# Patient Record
Sex: Male | Born: 1985 | Race: Black or African American | Hispanic: No | Marital: Single | Smoking: Former smoker
Health system: Southern US, Community
[De-identification: ages and names within clinical notes are randomized; demographics above are authoritative.]

## PROBLEM LIST (undated history)

## (undated) DIAGNOSIS — F0781 Postconcussional syndrome: Secondary | ICD-10-CM

## (undated) DIAGNOSIS — S0990XA Unspecified injury of head, initial encounter: Secondary | ICD-10-CM

## (undated) HISTORY — PX: APPENDECTOMY: SHX54

---
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
## 2008-12-12 ENCOUNTER — Emergency Department: Payer: Self-pay | Admitting: Emergency Medicine

## 2008-12-14 ENCOUNTER — Emergency Department: Payer: Self-pay | Admitting: Emergency Medicine

## 2009-01-03 ENCOUNTER — Emergency Department: Payer: Self-pay | Admitting: Emergency Medicine

## 2009-03-16 ENCOUNTER — Emergency Department: Payer: Self-pay | Admitting: Emergency Medicine

## 2009-08-13 ENCOUNTER — Emergency Department: Payer: Self-pay | Admitting: Unknown Physician Specialty

## 2009-11-05 ENCOUNTER — Emergency Department: Payer: Self-pay | Admitting: Emergency Medicine

## 2011-03-28 ENCOUNTER — Emergency Department: Payer: Self-pay | Admitting: Emergency Medicine

## 2012-08-21 ENCOUNTER — Emergency Department: Payer: Self-pay | Admitting: Emergency Medicine

## 2012-08-21 LAB — COMPREHENSIVE METABOLIC PANEL
Anion Gap: 6 — ABNORMAL LOW (ref 7–16)
Bilirubin,Total: 0.4 mg/dL (ref 0.2–1.0)
EGFR (African American): >60
Glucose: 89 mg/dL (ref 65–99)
Osmolality: 270 (ref 275–301)
SGPT (ALT): 15 U/L (ref 12–78)
Sodium: 136 mmol/L (ref 136–145)
Total Protein: 8 g/dL (ref 6.4–8.2)

## 2012-08-21 LAB — URINALYSIS, COMPLETE
Bacteria: NONE SEEN
Blood: NEGATIVE
Glucose,UR: NEGATIVE mg/dL (ref 0–75)
Ketone: NEGATIVE
Leukocyte Esterase: NEGATIVE
Nitrite: NEGATIVE
RBC,UR: <1 /HPF (ref 0–5)
Specific Gravity: 1.009 (ref 1.003–1.030)
WBC UR: 1 /HPF (ref 0–5)

## 2012-08-21 LAB — CBC
HGB: 14.2 g/dL (ref 13.0–18.0)
MCH: 30.1 pg (ref 26.0–34.0)
MCV: 84 fL (ref 80–100)
RBC: 4.73 10*6/uL (ref 4.40–5.90)
RDW: 11.6 % (ref 11.5–14.5)

## 2012-08-21 LAB — LIPASE, BLOOD: Lipase: 107 U/L (ref 73–393)

## 2013-02-24 ENCOUNTER — Emergency Department: Payer: Self-pay | Admitting: Emergency Medicine

## 2013-05-18 ENCOUNTER — Emergency Department: Payer: Self-pay | Admitting: Emergency Medicine

## 2013-05-18 LAB — CBC
HCT: 42.5 % (ref 40.0–52.0)
HGB: 14.5 g/dL (ref 13.0–18.0)
MCV: 85 fL (ref 80–100)
RBC: 5.02 10*6/uL (ref 4.40–5.90)
RDW: 11.7 % (ref 11.5–14.5)

## 2013-05-18 LAB — COMPREHENSIVE METABOLIC PANEL
Albumin: 4.6 g/dL (ref 3.4–5.0)
Alkaline Phosphatase: 71 U/L (ref 50–136)
Anion Gap: 4 — ABNORMAL LOW (ref 7–16)
Bilirubin,Total: 0.7 mg/dL (ref 0.2–1.0)
EGFR (Non-African Amer.): >60
Glucose: 98 mg/dL (ref 65–99)
Potassium: 4.1 mmol/L (ref 3.5–5.1)
SGOT(AST): 26 U/L (ref 15–37)
SGPT (ALT): 20 U/L (ref 12–78)
Sodium: 133 mmol/L — ABNORMAL LOW (ref 136–145)
Total Protein: 7.9 g/dL (ref 6.4–8.2)

## 2013-05-19 LAB — URINALYSIS, COMPLETE
Bacteria: NONE SEEN
Bilirubin,UR: NEGATIVE
Glucose,UR: NEGATIVE mg/dL (ref 0–75)
Ketone: NEGATIVE
Nitrite: NEGATIVE
Ph: 7 (ref 4.5–8.0)
Protein: NEGATIVE
Specific Gravity: 1.016 (ref 1.003–1.030)
WBC UR: <1 /HPF (ref 0–5)

## 2013-05-19 LAB — TROPONIN I: Troponin-I: <0.02 ng/mL

## 2013-10-23 ENCOUNTER — Emergency Department: Payer: Self-pay | Admitting: Emergency Medicine

## 2013-10-31 ENCOUNTER — Emergency Department: Payer: Self-pay | Admitting: Emergency Medicine

## 2014-04-23 ENCOUNTER — Emergency Department: Payer: Self-pay | Admitting: Internal Medicine

## 2014-04-23 LAB — CBC
HCT: 40.7 % (ref 40.0–52.0)
HGB: 13.4 g/dL (ref 13.0–18.0)
MCH: 28.2 pg (ref 26.0–34.0)
MCHC: 32.8 g/dL (ref 32.0–36.0)
MCV: 86 fL (ref 80–100)
Platelet: 209 10*3/uL (ref 150–440)
RBC: 4.73 10*6/uL (ref 4.40–5.90)
RDW: 11.7 % (ref 11.5–14.5)
WBC: 8.3 10*3/uL (ref 3.8–10.6)

## 2014-04-23 LAB — COMPREHENSIVE METABOLIC PANEL
ALBUMIN: 3.8 g/dL (ref 3.4–5.0)
ALT: 16 U/L
ANION GAP: 7 (ref 7–16)
Alkaline Phosphatase: 55 U/L
BUN: 9 mg/dL (ref 7–18)
Bilirubin,Total: 0.6 mg/dL (ref 0.2–1.0)
CALCIUM: 8.6 mg/dL (ref 8.5–10.1)
CO2: 27 mmol/L (ref 21–32)
Chloride: 105 mmol/L (ref 98–107)
Creatinine: 1.46 mg/dL — ABNORMAL HIGH (ref 0.60–1.30)
GLUCOSE: 83 mg/dL (ref 65–99)
Osmolality: 275 (ref 275–301)
Potassium: 3.4 mmol/L — ABNORMAL LOW (ref 3.5–5.1)
SGOT(AST): 24 U/L (ref 15–37)
SODIUM: 139 mmol/L (ref 136–145)
TOTAL PROTEIN: 6.9 g/dL (ref 6.4–8.2)

## 2014-04-23 LAB — PROTIME-INR
INR: 1.1
Prothrombin Time: 13.6 secs (ref 11.5–14.7)

## 2014-04-23 LAB — ETHANOL

## 2014-04-23 LAB — LIPASE, BLOOD: LIPASE: 79 U/L (ref 73–393)

## 2014-05-23 ENCOUNTER — Emergency Department: Payer: Self-pay | Admitting: Emergency Medicine

## 2014-09-29 ENCOUNTER — Emergency Department: Payer: Self-pay | Admitting: Emergency Medicine

## 2015-03-13 ENCOUNTER — Encounter: Payer: Self-pay | Admitting: Emergency Medicine

## 2015-03-13 ENCOUNTER — Emergency Department
Admission: EM | Admit: 2015-03-13 | Discharge: 2015-03-13 | Disposition: A | Discharge status: Home / Self Care | Payer: Managed Care, Other (non HMO) | Attending: Emergency Medicine | Admitting: Emergency Medicine

## 2015-03-13 DIAGNOSIS — N342 Other urethritis: Secondary | ICD-10-CM | POA: Insufficient documentation

## 2015-03-13 DIAGNOSIS — R300 Dysuria: Secondary | ICD-10-CM | POA: Diagnosis present

## 2015-03-13 DIAGNOSIS — Z720 Tobacco use: Secondary | ICD-10-CM | POA: Diagnosis not present

## 2015-03-13 LAB — URINALYSIS COMPLETE WITH MICROSCOPIC (ARMC ONLY)
BILIRUBIN URINE: NEGATIVE
Bacteria, UA: NONE SEEN
GLUCOSE, UA: NEGATIVE mg/dL
HGB URINE DIPSTICK: NEGATIVE
Ketones, ur: NEGATIVE mg/dL
Nitrite: NEGATIVE
PH: 6 (ref 5.0–8.0)
Protein, ur: NEGATIVE mg/dL
Specific Gravity, Urine: 1.023 (ref 1.005–1.030)

## 2015-03-13 LAB — CHLAMYDIA/NGC RT PCR (ARMC ONLY)
Chlamydia Tr: NOT DETECTED
N GONORRHOEAE: DETECTED — AB

## 2015-03-13 MED ORDER — CEFTRIAXONE SODIUM 250 MG IJ SOLR
250.0000 mg | Freq: Once | INTRAMUSCULAR | Status: AC
  Administered 2015-03-13: 250 mg via INTRAMUSCULAR
  Filled 2015-03-13: qty 250

## 2015-03-13 MED ORDER — AZITHROMYCIN 250 MG PO TABS
1000.0000 mg | ORAL_TABLET | Freq: Once | ORAL | Status: AC
  Administered 2015-03-13: 1000 mg via ORAL
  Filled 2015-03-13: qty 4

## 2015-03-13 NOTE — ED Notes (Signed)
Reports difficulty urinating and burning with urination.  NAD

## 2015-03-13 NOTE — ED Provider Notes (Signed)
[facility] Emergency Department Provider Note  ____________________________________________  Time seen:  7:57 PM  I have reviewed the triage vital signs and the nursing notes.   HISTORY  Chief Complaint Dysuria   HPI Nicholas Robles. is a 29 y.o. male this is here with complaint of dysuria for 2 days. He states he had unprotected sex last week. 2 days ago he began having dysuria along with discharge. He is unaware that his sexual partner has any problems. He denies any nausea, vomiting or fever.He denies any previous history of urinary tract infections. Pain at present is 4 out of 10. He does not have a PCP.  History reviewed. No pertinent past medical history.  There are no active problems to display for this patient.   History reviewed. No pertinent past surgical history.  No current outpatient prescriptions on file.  Allergies Review of patient's allergies indicates no known allergies.  History reviewed. No pertinent family history.  Social History History  Substance Use Topics  . Smoking status: Current Every Day Smoker  . Smokeless tobacco: Not on file  . Alcohol Use: Yes    Review of Systems Constitutional: No fever/chills Eyes: No visual changes. ENT: No sore throat. Cardiovascular: Denies chest pain. Respiratory: Denies shortness of breath. Gastrointestinal: No abdominal pain.  No nausea, no vomiting.  Genitourinary: Positive for dysuria. Musculoskeletal: Negative for back pain. Skin: Negative for rash. Neurological: Negative for headaches, focal weakness or numbness.  10-point ROS otherwise negative.  ____________________________________________   PHYSICAL EXAM:  VITAL SIGNS: ED Triage Vitals  Enc Vitals Group     BP 03/13/15 1856 131/73 mmHg     Pulse Rate 03/13/15 1856 58     Resp 03/13/15 1856 16     Temp 03/13/15 1856 98.1 F (36.7 C)     Temp Source 03/13/15 1856 Oral     SpO2 03/13/15 1856 100 %   Weight 03/13/15 1856 140 lb (63.504 kg)     Height 03/13/15 1856  (1.727 m)     Head Cir --      Peak Flow --      Pain Score 03/13/15 1857 4     Pain Loc --      Pain Edu? --      Excl. in GC? --     Constitutional: Alert and oriented. Well appearing and in no acute distress. Eyes: Conjunctivae are normal. PERRL. EOMI. Head: Atraumatic. Nose: No congestion/rhinnorhea. Neck: No stridor.   Cardiovascular: Normal rate, regular rhythm. Grossly normal heart sounds.  Good peripheral circulation. Respiratory: Normal respiratory effort.  No retractions. Lungs CTAB. Gastrointestinal: Soft and nontender. No distention.  No CVA tenderness. Musculoskeletal: No lower extremity tenderness nor edema.  No joint effusions. Neurologic:  Normal speech and language. No gross focal neurologic deficits are appreciated. No gait instability. Skin:  Skin is warm, dry and intact. No rash noted. Psychiatric: Mood and affect are normal. Speech and behavior are normal.  ____________________________________________   LABS (all labs ordered are listed, but only abnormal results are displayed)  Labs Reviewed  URINALYSIS COMPLETEWITH MICROSCOPIC ([facility] ONLY) - Abnormal; Notable for the following:    Color, Urine YELLOW (*)    APPearance HAZY (*)    Leukocytes, UA 2+ (*)    Squamous Epithelial / LPF 0-5 (*)    All other components within normal limits  CHLAMYDIA/NGC RT PCR ([facility] ONLY)    PROCEDURES  Procedure(s) performed: None  Critical Care performed: No  ____________________________________________  INITIAL IMPRESSION / ASSESSMENT AND PLAN / ED COURSE  Pertinent labs & imaging results that were available during my care of the patient were reviewed by me and considered in my medical decision making (see chart for details).  Urinalysis shows stay BCs to numerous to count with negative nitrites and no bacteria. Patient at high risk for STDs was treated with Rocephin 250 mg IM and  Zithromax 1 g by mouth. He was instructed not to have sex until his partner has been treated. He was given information about [facility] ST clinic. ____________________________________________   FINAL CLINICAL IMPRESSION(S) / ED DIAGNOSES  Final diagnoses:  Urethritis      Tommi Rumps, PA-C 03/13/15 2048  Emily Filbert, MD 03/13/15 2125

## 2015-03-13 NOTE — Discharge Instructions (Signed)
Urethritis Urethritis is an inflammation of the tube through which urine exits your bladder (urethra).  CAUSES Urethritis is often caused by an infection in your urethra. The infection can be viral, like herpes. The infection can also be bacterial, like gonorrhea. RISK FACTORS Risk factors of urethritis include:  Having sex without using a condom.  Having multiple sexual partners.  Having poor hygiene. SIGNS AND SYMPTOMS Symptoms of urethritis are less noticeable in women than in men. These symptoms include:  Burning feeling when you urinate (dysuria).  Discharge from your urethra.  Blood in your urine (hematuria).  Urinating more than usual. DIAGNOSIS  To confirm a diagnosis of urethritis, your health care provider will do the following:  Ask about your sexual history.  Perform a physical exam.  Have you provide a sample of your urine for lab testing.  Use a cotton swab to gently collect a sample from your urethra for lab testing. TREATMENT  It is important to treat urethritis. Depending on the cause, untreated urethritis may lead to serious genital infections and possibly infertility. Urethritis caused by a bacterial infection is treated with antibiotic medicine. All sexual partners must be treated.  HOME CARE INSTRUCTIONS  Do not have sex until the test results are known and treatment is completed, even if your symptoms go away before you finish treatment.  If you were prescribed an antibiotic, finish it all even if you start to feel better. SEEK MEDICAL CARE IF:   Your symptoms are not improved in 3 days.  Your symptoms are getting worse.  You develop abdominal pain or pelvic pain (in women).  You develop joint pain.  You have a fever. SEEK IMMEDIATE MEDICAL CARE IF:   You have severe pain in the belly, back, or side.  You have repeated vomiting. MAKE SURE YOU:  Understand these instructions.  Will watch your condition.  Will get help right away if you  are not doing well or get worse. Document Released: 01/29/2001 Document Revised: 12/20/2013 Document Reviewed: 04/05/2013 [facility] Patient Information 2015 [place], [place]. This information is not intended to replace advice given to you by your health care provider. Make sure you discuss any questions you have with your health care provider.   HAVE YOUR PARTNER GO TO [facility] DEPARTMENT FOR SCREENING

## 2015-08-29 ENCOUNTER — Emergency Department: Payer: Managed Care, Other (non HMO)

## 2015-08-29 ENCOUNTER — Encounter: Payer: Self-pay | Admitting: *Deleted

## 2015-08-29 ENCOUNTER — Emergency Department
Admission: EM | Admit: 2015-08-29 | Discharge: 2015-08-29 | Disposition: A | Discharge status: Home / Self Care | Payer: Managed Care, Other (non HMO) | Attending: Emergency Medicine | Admitting: Emergency Medicine

## 2015-08-29 DIAGNOSIS — S5001XA Contusion of right elbow, initial encounter: Secondary | ICD-10-CM

## 2015-08-29 DIAGNOSIS — F17200 Nicotine dependence, unspecified, uncomplicated: Secondary | ICD-10-CM | POA: Insufficient documentation

## 2015-08-29 DIAGNOSIS — S8001XA Contusion of right knee, initial encounter: Secondary | ICD-10-CM | POA: Insufficient documentation

## 2015-08-29 DIAGNOSIS — Y998 Other external cause status: Secondary | ICD-10-CM | POA: Diagnosis not present

## 2015-08-29 DIAGNOSIS — S4991XA Unspecified injury of right shoulder and upper arm, initial encounter: Secondary | ICD-10-CM | POA: Insufficient documentation

## 2015-08-29 DIAGNOSIS — Y92410 Unspecified street and highway as the place of occurrence of the external cause: Secondary | ICD-10-CM | POA: Diagnosis not present

## 2015-08-29 DIAGNOSIS — Y9389 Activity, other specified: Secondary | ICD-10-CM | POA: Diagnosis not present

## 2015-08-29 DIAGNOSIS — S59901A Unspecified injury of right elbow, initial encounter: Secondary | ICD-10-CM | POA: Diagnosis present

## 2015-08-29 MED ORDER — IBUPROFEN 800 MG PO TABS: 800.0000 mg | ORAL_TABLET | Freq: Three times a day (TID) | ORAL | Status: DC | PRN

## 2015-08-29 MED ORDER — CYCLOBENZAPRINE HCL 10 MG PO TABS: 10.0000 mg | ORAL_TABLET | Freq: Three times a day (TID) | ORAL | Status: DC | PRN
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
## 2015-12-19 ENCOUNTER — Emergency Department
Admission: EM | Admit: 2015-12-19 | Discharge: 2015-12-19 | Disposition: A | Discharge status: Home / Self Care | Payer: Managed Care, Other (non HMO) | Attending: Emergency Medicine | Admitting: Emergency Medicine

## 2015-12-19 ENCOUNTER — Encounter: Payer: Self-pay | Admitting: Emergency Medicine

## 2015-12-19 DIAGNOSIS — F17200 Nicotine dependence, unspecified, uncomplicated: Secondary | ICD-10-CM | POA: Insufficient documentation

## 2015-12-19 DIAGNOSIS — J029 Acute pharyngitis, unspecified: Secondary | ICD-10-CM | POA: Diagnosis present

## 2015-12-19 DIAGNOSIS — B349 Viral infection, unspecified: Secondary | ICD-10-CM

## 2015-12-19 DIAGNOSIS — Z79899 Other long term (current) drug therapy: Secondary | ICD-10-CM | POA: Diagnosis not present

## 2015-12-19 LAB — POCT RAPID STREP A: STREPTOCOCCUS, GROUP A SCREEN (DIRECT): NEGATIVE

## 2015-12-19 NOTE — Discharge Instructions (Signed)
Viral Infections °A viral infection can be caused by different types of viruses. Most viral infections are not serious and resolve on their own. However, some infections may cause severe symptoms and may lead to further complications. °SYMPTOMS °Viruses can frequently cause: °· Minor sore throat. °· Aches and pains. °· Headaches. °· Runny nose. °· Different types of rashes. °· Watery eyes. °· Tiredness. °· Cough. °· Loss of appetite. °· Gastrointestinal infections, resulting in nausea, vomiting, and diarrhea. °These symptoms do not respond to antibiotics because the infection is not caused by bacteria. However, you might catch a bacterial infection following the viral infection. This is sometimes called a "superinfection." Symptoms of such a bacterial infection may include: °· Worsening sore throat with pus and difficulty swallowing. °· Swollen neck glands. °· Chills and a high or persistent fever. °· Severe headache. °· Tenderness over the sinuses. °· Persistent overall ill feeling (malaise), muscle aches, and tiredness (fatigue). °· Persistent cough. °· Yellow, green, or brown mucus production with coughing. °HOME CARE INSTRUCTIONS  °· Only take over-the-counter or prescription medicines for pain, discomfort, diarrhea, or fever as directed by your caregiver. °· Drink enough water and fluids to keep your urine clear or pale yellow. Sports drinks can provide valuable electrolytes, sugars, and hydration. °· Get plenty of rest and maintain proper nutrition. Soups and broths with crackers or rice are fine. °SEEK IMMEDIATE MEDICAL CARE IF:  °· You have severe headaches, shortness of breath, chest pain, neck pain, or an unusual rash. °· You have uncontrolled vomiting, diarrhea, or you are unable to keep down fluids. °· You or your child has an oral temperature above 102° F (38.9° C), not controlled by medicine. °· Your baby is older than 3 months with a rectal temperature of 102° F (38.9° C) or higher. °· Your baby is 3  months old or younger with a rectal temperature of 100.4° F (38° C) or higher. °MAKE SURE YOU:  °· Understand these instructions. °· Will watch your condition. °· Will get help right away if you are not doing well or get worse. °  °This information is not intended to replace advice given to you by your health care provider. Make sure you discuss any questions you have with your health care provider. °  °Document Released: 05/15/2005 Document Revised: 10/28/2011 Document Reviewed: 01/11/2015 °Elsevier Interactive Patient Education ©2016 Elsevier Inc. ° °

## 2015-12-19 NOTE — ED Notes (Signed)
Pt to ed with c/o cold chills, sore throat x 1 day.

## 2015-12-19 NOTE — ED Provider Notes (Signed)
[facility] Emergency Department Provider Note  ____________________________________________  Time seen: Approximately 5:28 PM  I have reviewed the triage vital signs and the nursing notes.   HISTORY  Chief Complaint Sore Throat    HPI Nicholas DomeKendrick E Owczarzak Jr. is a 30 y.o. male , NAD, presents emergency department with 24 hours of sore throat and body aches. States he feels slightly fatigued. Has not had any fevers, chills, abdominal pain, nausea, vomiting, diarrhea. No significant nasal congestion, runny nose, ear pain, sinus pressure. Has not taken anything over-the-counter at this time for his symptoms. No known sick contacts.   History reviewed. No pertinent past medical history.  There are no active problems to display for this patient.   History reviewed. No pertinent past surgical history.  Current Outpatient Rx  Name  Route  Sig  Dispense  Refill  . cyclobenzaprine (FLEXERIL) 10 MG tablet   Oral   Take 1 tablet (10 mg total) by mouth every 8 (eight) hours as needed for muscle spasms.   30 tablet   1   . ibuprofen (ADVIL,MOTRIN) 800 MG tablet   Oral   Take 1 tablet (800 mg total) by mouth every 8 (eight) hours as needed.   30 tablet   0     Allergies Review of patient's allergies indicates no known allergies.  History reviewed. No pertinent family history.  Social History Social History  Substance Use Topics  . Smoking status: Current Every Day Smoker  . Smokeless tobacco: None  . Alcohol Use: Yes     Review of Systems  Constitutional: Positive chills, fatigue. No fever/chills Eyes: No visual changes. No discharge, swelling, redness ENT: Positive sore throat. No sinus pressure, ear pain, congestion, runny nose Cardiovascular: No chest pain. Respiratory: Positive occasional cough. No congestion, shortness of breath. No wheezing.  Gastrointestinal: No abdominal pain.  No nausea, vomiting.  No diarrhea.   Musculoskeletal:  Negative for general myalgias  Skin: Negative for rash. Neurological: Negative for headaches, focal weakness or numbness. 10-point ROS otherwise negative.  ____________________________________________   PHYSICAL EXAM:  VITAL SIGNS: ED Triage Vitals  Enc Vitals Group     BP 12/19/15 1635 128/74 mmHg     Pulse Rate 12/19/15 1635 98     Resp 12/19/15 1635 16     Temp 12/19/15 1635 98.1 F (36.7 C)     Temp Source 12/19/15 1635 Oral     SpO2 12/19/15 1635 98 %     Weight 12/19/15 1635 170 lb (77.111 kg)     Height 12/19/15 1635 6\' 1"  (1.854 m)     Head Cir --      Peak Flow --      Pain Score 12/19/15 1623 8     Pain Loc --      Pain Edu? --      Excl. in GC? --      Constitutional: Alert and oriented. Well appearing and in no acute distress. Eyes: Conjunctivae are normal. PERRL. EOMI without pain.  Head: Atraumatic. ENT:      Ears: TMs visualized bilaterally without effusion, bulging, perforation, erythema      Nose: No congestion/rhinnorhea. Turbinates mildly injected.      Mouth/Throat: Mucous membranes are moist. Pharynx with mild injection but no overt erythema. No pharyngeal swelling or exudates. Uvula is midline. Clear postnasal drip. Neck: No cervical spine tenderness to palpation. Supple for range of motion. Hematological/Lymphatic/Immunilogical: No cervical lymphadenopathy. Cardiovascular: Normal rate, regular rhythm. Normal S1 and S2.  Respiratory: Normal respiratory effort without tachypnea or retractions. Lungs CTAB with breath sounds noted in all lung fields. Neurologic:  Normal speech and language. No gross focal neurologic deficits are appreciated.  Skin:  Skin is warm, dry and intact. No rash noted. Psychiatric: Mood and affect are normal. Speech and behavior are normal. Patient exhibits appropriate insight and judgement.   ____________________________________________   LABS (all labs ordered are listed, but only abnormal results are displayed)  Labs  Reviewed  CULTURE, GROUP A STREP [facility])  POCT RAPID STREP A   ____________________________________________  EKG  None ____________________________________________  RADIOLOGY  None ____________________________________________    PROCEDURES  Procedure(s) performed: None    Medications - No data to display   ____________________________________________   INITIAL IMPRESSION / ASSESSMENT AND PLAN / ED COURSE  Pertinent lab results that were available during my care of the patient were reviewed by me and considered in my medical decision making (see chart for details).  Patient's diagnosis is consistent with file infection. Patient will be discharged home with instructions to take over-the-counter cough and cold medications as needed for symptom Medicare. Patient is to follow up with [facility] if symptoms persist past this treatment course. Patient is given ED precautions to return to the ED for any worsening or new symptoms.      ____________________________________________  FINAL CLINICAL IMPRESSION(S) / ED DIAGNOSES  Final diagnoses:  Viral infection      NEW MEDICATIONS STARTED DURING THIS VISIT:  Discharge Medication List as of 12/19/2015  5:28 PM           Hope Pigeon, PA-C 12/19/15 1806  Sharman Cheek, MD 12/21/15 0015

## 2015-12-22 LAB — CULTURE, GROUP A STREP (THRC)

## 2015-12-24 ENCOUNTER — Emergency Department
Admission: EM | Admit: 2015-12-24 | Discharge: 2015-12-24 | Disposition: A | Discharge status: Home / Self Care | Payer: Managed Care, Other (non HMO) | Attending: Emergency Medicine | Admitting: Emergency Medicine

## 2015-12-24 ENCOUNTER — Encounter: Payer: Self-pay | Admitting: Emergency Medicine

## 2015-12-24 ENCOUNTER — Emergency Department: Payer: Managed Care, Other (non HMO)

## 2015-12-24 DIAGNOSIS — R0789 Other chest pain: Secondary | ICD-10-CM | POA: Insufficient documentation

## 2015-12-24 DIAGNOSIS — M25511 Pain in right shoulder: Secondary | ICD-10-CM | POA: Insufficient documentation

## 2015-12-24 DIAGNOSIS — S161XXA Strain of muscle, fascia and tendon at neck level, initial encounter: Secondary | ICD-10-CM | POA: Insufficient documentation

## 2015-12-24 DIAGNOSIS — Y999 Unspecified external cause status: Secondary | ICD-10-CM | POA: Insufficient documentation

## 2015-12-24 DIAGNOSIS — F17200 Nicotine dependence, unspecified, uncomplicated: Secondary | ICD-10-CM | POA: Insufficient documentation

## 2015-12-24 DIAGNOSIS — G44319 Acute post-traumatic headache, not intractable: Secondary | ICD-10-CM

## 2015-12-24 DIAGNOSIS — Y939 Activity, unspecified: Secondary | ICD-10-CM | POA: Insufficient documentation

## 2015-12-24 DIAGNOSIS — Y92410 Unspecified street and highway as the place of occurrence of the external cause: Secondary | ICD-10-CM | POA: Insufficient documentation

## 2015-12-24 DIAGNOSIS — M542 Cervicalgia: Secondary | ICD-10-CM | POA: Diagnosis present

## 2015-12-24 DIAGNOSIS — R52 Pain, unspecified: Secondary | ICD-10-CM

## 2015-12-24 LAB — BASIC METABOLIC PANEL
Anion gap: 9 (ref 5–15)
BUN: 10 mg/dL (ref 6–20)
CALCIUM: 9.2 mg/dL (ref 8.9–10.3)
CHLORIDE: 103 mmol/L (ref 101–111)
CO2: 27 mmol/L (ref 22–32)
CREATININE: 1.12 mg/dL (ref 0.61–1.24)
Glucose, Bld: 98 mg/dL (ref 65–99)
Potassium: 3.7 mmol/L (ref 3.5–5.1)
SODIUM: 139 mmol/L (ref 135–145)

## 2015-12-24 LAB — CBC WITH DIFFERENTIAL/PLATELET
Basophils Absolute: 0 10*3/uL (ref 0–0.1)
Basophils Relative: 0 %
EOS ABS: 0.1 10*3/uL (ref 0–0.7)
HCT: 39.2 % — ABNORMAL LOW (ref 40.0–52.0)
HEMOGLOBIN: 13.3 g/dL (ref 13.0–18.0)
LYMPHS ABS: 1.9 10*3/uL (ref 1.0–3.6)
Lymphocytes Relative: 20 %
MCH: 28.3 pg (ref 26.0–34.0)
MCHC: 34 g/dL (ref 32.0–36.0)
MCV: 83.4 fL (ref 80.0–100.0)
MONO ABS: 0.5 10*3/uL (ref 0.2–1.0)
Neutro Abs: 7.1 10*3/uL — ABNORMAL HIGH (ref 1.4–6.5)
Platelets: 184 10*3/uL (ref 150–440)
RBC: 4.71 MIL/uL (ref 4.40–5.90)
RDW: 11.6 % (ref 11.5–14.5)
WBC: 9.5 10*3/uL (ref 3.8–10.6)

## 2015-12-24 MED ORDER — ACETAMINOPHEN 500 MG PO TABS
1000.0000 mg | ORAL_TABLET | Freq: Once | ORAL | Status: AC
  Administered 2015-12-24: 1000 mg via ORAL
  Filled 2015-12-24: qty 2

## 2015-12-24 MED ORDER — LORAZEPAM 1 MG PO TABS
1.0000 mg | ORAL_TABLET | Freq: Once | ORAL | Status: AC
  Administered 2015-12-24: 1 mg via ORAL
  Filled 2015-12-24: qty 1

## 2015-12-24 MED ORDER — HYDROCODONE-ACETAMINOPHEN 5-325 MG PO TABS: 1.0000 | ORAL_TABLET | ORAL | Status: DC | PRN
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
## 2015-12-24 NOTE — Discharge Instructions (Signed)
Ice and elevate as needed for pain and swelling. Norco as needed for severe pain every 4 hours as needed. [facility] if any continued problems. It was suggested on your MRA that you have yearly checkups. This can be best done by obtaining a primary care doctor to order this and have an annual physical. Today's MRI does not suggest that you have an aneurysm.
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
## 2016-05-28 ENCOUNTER — Emergency Department
Admission: EM | Admit: 2016-05-28 | Discharge: 2016-05-28 | Disposition: A | Discharge status: Home / Self Care | Payer: Managed Care, Other (non HMO) | Attending: Emergency Medicine | Admitting: Emergency Medicine

## 2016-05-28 ENCOUNTER — Encounter: Payer: Self-pay | Admitting: Emergency Medicine

## 2016-05-28 DIAGNOSIS — R51 Headache: Secondary | ICD-10-CM | POA: Insufficient documentation

## 2016-05-28 DIAGNOSIS — Z79899 Other long term (current) drug therapy: Secondary | ICD-10-CM | POA: Diagnosis not present

## 2016-05-28 DIAGNOSIS — F17200 Nicotine dependence, unspecified, uncomplicated: Secondary | ICD-10-CM | POA: Diagnosis not present

## 2016-05-28 DIAGNOSIS — R519 Headache, unspecified: Secondary | ICD-10-CM

## 2016-05-28 HISTORY — DX: Unspecified injury of head, initial encounter: S09.90XA

## 2016-05-28 LAB — CBC
HEMATOCRIT: 40.8 % (ref 40.0–52.0)
HEMOGLOBIN: 13.8 g/dL (ref 13.0–18.0)
MCH: 28.2 pg (ref 26.0–34.0)
MCHC: 33.7 g/dL (ref 32.0–36.0)
MCV: 83.6 fL (ref 80.0–100.0)
Platelets: 150 10*3/uL (ref 150–440)
RBC: 4.88 MIL/uL (ref 4.40–5.90)
RDW: 11.8 % (ref 11.5–14.5)
WBC: 5.5 10*3/uL (ref 3.8–10.6)

## 2016-05-28 LAB — BASIC METABOLIC PANEL
ANION GAP: 5 (ref 5–15)
BUN: 12 mg/dL (ref 6–20)
CALCIUM: 9.1 mg/dL (ref 8.9–10.3)
CHLORIDE: 104 mmol/L (ref 101–111)
CO2: 29 mmol/L (ref 22–32)
Creatinine, Ser: 1.25 mg/dL — ABNORMAL HIGH (ref 0.61–1.24)
GFR calc non Af Amer: >60 mL/min (ref >60)
GLUCOSE: 97 mg/dL (ref 65–99)
Potassium: 4.2 mmol/L (ref 3.5–5.1)
Sodium: 138 mmol/L (ref 135–145)

## 2016-05-28 MED ORDER — SODIUM CHLORIDE 0.9 % IV BOLUS (SEPSIS)
1000.0000 mL | Freq: Once | INTRAVENOUS | Status: AC
  Administered 2016-05-28: 1000 mL via INTRAVENOUS

## 2016-05-28 MED ORDER — PROCHLORPERAZINE EDISYLATE 5 MG/ML IJ SOLN
10.0000 mg | Freq: Once | INTRAMUSCULAR | Status: AC
  Administered 2016-05-28: 10 mg via INTRAVENOUS
  Filled 2016-05-28: qty 2

## 2016-05-28 MED ORDER — KETOROLAC TROMETHAMINE 30 MG/ML IJ SOLN
30.0000 mg | Freq: Once | INTRAMUSCULAR | Status: AC
  Administered 2016-05-28: 30 mg via INTRAVENOUS
  Filled 2016-05-28: qty 1

## 2016-05-28 MED ORDER — DIPHENHYDRAMINE HCL 50 MG/ML IJ SOLN
12.5000 mg | Freq: Once | INTRAMUSCULAR | Status: AC
  Administered 2016-05-28: 12.5 mg via INTRAVENOUS
  Filled 2016-05-28: qty 1
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
## 2016-05-28 NOTE — ED Notes (Signed)
Pt resting in bed, eyes closed, resp even and unlabored, family at bedside 
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
## 2016-06-23 ENCOUNTER — Encounter: Payer: Self-pay | Admitting: Emergency Medicine

## 2016-06-23 ENCOUNTER — Emergency Department
Admission: EM | Admit: 2016-06-23 | Discharge: 2016-06-23 | Disposition: A | Discharge status: Home / Self Care | Payer: Managed Care, Other (non HMO) | Attending: Emergency Medicine | Admitting: Emergency Medicine

## 2016-06-23 DIAGNOSIS — Y939 Activity, unspecified: Secondary | ICD-10-CM | POA: Diagnosis not present

## 2016-06-23 DIAGNOSIS — S3992XA Unspecified injury of lower back, initial encounter: Secondary | ICD-10-CM | POA: Diagnosis present

## 2016-06-23 DIAGNOSIS — F17200 Nicotine dependence, unspecified, uncomplicated: Secondary | ICD-10-CM | POA: Insufficient documentation

## 2016-06-23 DIAGNOSIS — Y92410 Unspecified street and highway as the place of occurrence of the external cause: Secondary | ICD-10-CM | POA: Diagnosis not present

## 2016-06-23 DIAGNOSIS — S161XXA Strain of muscle, fascia and tendon at neck level, initial encounter: Secondary | ICD-10-CM | POA: Insufficient documentation

## 2016-06-23 DIAGNOSIS — Y999 Unspecified external cause status: Secondary | ICD-10-CM | POA: Insufficient documentation

## 2016-06-23 DIAGNOSIS — S60221A Contusion of right hand, initial encounter: Secondary | ICD-10-CM | POA: Diagnosis not present

## 2016-06-23 DIAGNOSIS — S39012A Strain of muscle, fascia and tendon of lower back, initial encounter: Secondary | ICD-10-CM | POA: Insufficient documentation

## 2016-06-23 DIAGNOSIS — Z79899 Other long term (current) drug therapy: Secondary | ICD-10-CM | POA: Insufficient documentation

## 2016-06-23 DIAGNOSIS — S29019A Strain of muscle and tendon of unspecified wall of thorax, initial encounter: Secondary | ICD-10-CM

## 2016-06-23 MED ORDER — CYCLOBENZAPRINE HCL 5 MG PO TABS: 5.0000 mg | ORAL_TABLET | Freq: Three times a day (TID) | ORAL | 0 refills | Status: DC | PRN

## 2016-06-23 MED ORDER — IBUPROFEN 800 MG PO TABS: 800.0000 mg | ORAL_TABLET | Freq: Three times a day (TID) | ORAL | 0 refills | Status: DC | PRN
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
## 2016-06-23 NOTE — Discharge Instructions (Signed)
Please take medications as prescribed. Return to the ER for any worsening symptoms urgent changes in her health. °

## 2016-06-24 DIAGNOSIS — F0781 Postconcussional syndrome: Secondary | ICD-10-CM | POA: Insufficient documentation

## 2016-06-24 DIAGNOSIS — G44229 Chronic tension-type headache, not intractable: Secondary | ICD-10-CM | POA: Insufficient documentation

## 2016-10-30 DIAGNOSIS — K219 Gastro-esophageal reflux disease without esophagitis: Secondary | ICD-10-CM | POA: Insufficient documentation

## 2016-12-23 ENCOUNTER — Emergency Department
Admission: EM | Admit: 2016-12-23 | Discharge: 2016-12-23 | Disposition: A | Discharge status: Home / Self Care | Payer: Managed Care, Other (non HMO) | Attending: Emergency Medicine | Admitting: Emergency Medicine

## 2016-12-23 ENCOUNTER — Encounter: Payer: Self-pay | Admitting: Emergency Medicine

## 2016-12-23 DIAGNOSIS — Z79899 Other long term (current) drug therapy: Secondary | ICD-10-CM | POA: Insufficient documentation

## 2016-12-23 DIAGNOSIS — G43909 Migraine, unspecified, not intractable, without status migrainosus: Secondary | ICD-10-CM | POA: Diagnosis present

## 2016-12-23 DIAGNOSIS — F17200 Nicotine dependence, unspecified, uncomplicated: Secondary | ICD-10-CM | POA: Insufficient documentation

## 2016-12-23 DIAGNOSIS — G43809 Other migraine, not intractable, without status migrainosus: Secondary | ICD-10-CM

## 2016-12-23 MED ORDER — BUTALBITAL-APAP-CAFFEINE 50-325-40 MG PO TABS: 1.0000 | ORAL_TABLET | Freq: Four times a day (QID) | ORAL | 0 refills | Status: DC | PRN

## 2016-12-23 MED ORDER — KETOROLAC TROMETHAMINE 30 MG/ML IJ SOLN
30.0000 mg | Freq: Once | INTRAMUSCULAR | Status: DC
Start: 2016-12-23 — End: 2016-12-23
  Filled 2016-12-23: qty 1

## 2016-12-23 MED ORDER — DIPHENHYDRAMINE HCL 50 MG/ML IJ SOLN
25.0000 mg | Freq: Once | INTRAMUSCULAR | Status: DC
  Filled 2016-12-23: qty 1

## 2016-12-23 MED ORDER — SODIUM CHLORIDE 0.9 % IV BOLUS (SEPSIS): 1000.0000 mL | Freq: Once | INTRAVENOUS | Status: DC

## 2016-12-23 MED ORDER — METOCLOPRAMIDE HCL 5 MG/ML IJ SOLN
10.0000 mg | Freq: Once | INTRAMUSCULAR | Status: DC
  Filled 2016-12-23: qty 2

## 2016-12-23 MED ORDER — BUTALBITAL-APAP-CAFFEINE 50-325-40 MG PO TABS
2.0000 | ORAL_TABLET | Freq: Once | ORAL | Status: AC
  Administered 2016-12-23: 2 via ORAL
  Filled 2016-12-23: qty 2

## 2016-12-23 NOTE — Discharge Instructions (Signed)
Please follow up with the acute care clinic °

## 2016-12-23 NOTE — ED Provider Notes (Signed)
[facility] Emergency Department Provider Note   ____________________________________________   First MD Initiated Contact with Patient 12/23/16 0403     (approximate)  I have reviewed the triage vital signs and the nursing notes.   HISTORY  Chief Complaint Migraine    HPI Nicholas DomeKendrick E Punch Jr. is a 31 y.o. male who comes into the hospital today with a headache. He reports that he's had this headache all day. The patient took 4 pills of nortriptyline to in the morning and 2 in the evening. He reports though that the pain has persisted. He's been unable to sleep. He has a history of migraines to post concussive syndrome. The patient denies any nausea or vomiting denies any dizziness denies any blurred vision. He rates his pain a 6 out of 10 in intensity. The patient reports that he has headaches typically every other day. He reports that he couldn't deal with it so he came in for evaluation. He's had no numbness and tingling   Past Medical History:  Diagnosis Date  . Head injury     There are no active problems to display for this patient.   Past Surgical History:  Procedure Laterality Date  . APPENDECTOMY      Prior to Admission medications   Medication Sig Start Date End Date Taking? Authorizing Provider  butalbital-acetaminophen-caffeine (FIORICET, ESGIC) [phone] MG tablet Take 1-2 tablets by mouth every 6 (six) hours as needed for headache. 12/23/16 12/23/17  Rebecka ApleyWebster, Allison P, MD  cyclobenzaprine (FLEXERIL) 5 MG tablet Take 1-2 tablets (5-10 mg total) by mouth 3 (three) times daily as needed for muscle spasms. 06/23/16   Evon SlackGaines, Thomas C, PA-C  HYDROcodone-acetaminophen (NORCO/VICODIN) 5-325 MG tablet Take 1 tablet by mouth every 4 (four) hours as needed for moderate pain. 12/24/15   Tommi RumpsSummers, Rhonda L, PA-C  ibuprofen (ADVIL,MOTRIN) 800 MG tablet Take 1 tablet (800 mg total) by mouth every 8 (eight) hours as needed. 06/23/16   Evon SlackGaines, Thomas C, PA-C      Allergies Patient has no known allergies.  No family history on file.  Social History Social History  Substance Use Topics  . Smoking status: Current Every Day Smoker  . Smokeless tobacco: Never Used  . Alcohol use Yes    Review of Systems  Constitutional: No fever/chills Eyes: No visual changes. ENT: No sore throat. Cardiovascular: Denies chest pain. Respiratory: Denies shortness of breath. Gastrointestinal: No abdominal pain.  No nausea, no vomiting.  No diarrhea.  No constipation. Genitourinary: Negative for dysuria. Musculoskeletal: Negative for back pain. Skin: Negative for rash. Neurological: Headache   ____________________________________________   PHYSICAL EXAM:  VITAL SIGNS: ED Triage Vitals [12/23/16 0229]  Enc Vitals Group     BP 132/81     Pulse Rate 88     Resp 18     Temp 98.2 F (36.8 C)     Temp Source Oral     SpO2 95 %     Weight 170 lb (77.1 kg)     Height 6\' 2"  (1.88 m)     Head Circumference      Peak Flow      Pain Score 10     Pain Loc      Pain Edu?      Excl. in GC?     Constitutional: Alert and oriented. Well appearing and in no acute distress. Eyes: Conjunctivae are normal. PERRL. EOMI. Head: Atraumatic. Nose: No congestion/rhinnorhea. Mouth/Throat: Mucous membranes are moist.  Oropharynx non-erythematous. Cardiovascular: Normal  rate, regular rhythm. Grossly normal heart sounds.  Good peripheral circulation. Respiratory: Normal respiratory effort.  No retractions. Lungs CTAB. Gastrointestinal: Soft and nontender. No distention. Positive bowel sounds Musculoskeletal: No lower extremity tenderness nor edema.  No joint effusions. Neurologic:  Normal speech and language. Cranial nerves II through XII are grossly intact with no focal motor or neuro deficits Skin:  Skin is warm, dry and intact. No rash noted. Psychiatric: Mood and affect are normal. Speech and behavior are  normal.  ____________________________________________   LABS (all labs ordered are listed, but only abnormal results are displayed)  Labs Reviewed - No data to display ____________________________________________  EKG  none ____________________________________________  RADIOLOGY  none ____________________________________________   PROCEDURES  Procedure(s) performed: None  Procedures  Critical Care performed: No  ____________________________________________   INITIAL IMPRESSION / ASSESSMENT AND PLAN / ED COURSE  Pertinent labs & imaging results that were available during my care of the patient were reviewed by me and considered in my medical decision making (see chart for details).  This is a 31 year old male who comes into the hospital today with a headache. I did initially order some IV medications but the patient reports that he does not want to be stuck with a needle. He reports that he thinks the pain is better enough that he could just take pills. I did give the patient a fair assessment 1 back in to check on him he was asleep comfortably. The patient will be discharged home to follow-up with the acute care clinic.      ____________________________________________   FINAL CLINICAL IMPRESSION(S) / ED DIAGNOSES  Final diagnoses:  Other migraine without status migrainosus, not intractable      NEW MEDICATIONS STARTED DURING THIS VISIT:  New Prescriptions   BUTALBITAL-ACETAMINOPHEN-CAFFEINE (FIORICET, ESGIC) 50-325-40 MG TABLET    Take 1-2 tablets by mouth every 6 (six) hours as needed for headache.     Note:  This document was prepared using Dragon voice recognition software and may include unintentional dictation errors.    Rebecka Apley, MD 12/23/16 [phone]

## 2016-12-23 NOTE — ED Triage Notes (Signed)
Patient with complaint of migraine that started earlier today. Patient states that he has taken medications with no relief.

## 2016-12-27 ENCOUNTER — Encounter: Payer: Self-pay | Admitting: *Deleted

## 2016-12-27 ENCOUNTER — Emergency Department
Admission: EM | Admit: 2016-12-27 | Discharge: 2016-12-27 | Disposition: A | Discharge status: Home / Self Care | Payer: Managed Care, Other (non HMO) | Attending: Emergency Medicine | Admitting: Emergency Medicine

## 2016-12-27 DIAGNOSIS — Z202 Contact with and (suspected) exposure to infections with a predominantly sexual mode of transmission: Secondary | ICD-10-CM | POA: Diagnosis not present

## 2016-12-27 DIAGNOSIS — N341 Nonspecific urethritis: Secondary | ICD-10-CM | POA: Insufficient documentation

## 2016-12-27 DIAGNOSIS — R300 Dysuria: Secondary | ICD-10-CM | POA: Diagnosis present

## 2016-12-27 DIAGNOSIS — F17200 Nicotine dependence, unspecified, uncomplicated: Secondary | ICD-10-CM | POA: Diagnosis not present

## 2016-12-27 DIAGNOSIS — N342 Other urethritis: Secondary | ICD-10-CM

## 2016-12-27 LAB — URINALYSIS, ROUTINE W REFLEX MICROSCOPIC
Bilirubin Urine: NEGATIVE
Glucose, UA: NEGATIVE mg/dL
Hgb urine dipstick: NEGATIVE
Ketones, ur: NEGATIVE mg/dL
Nitrite: NEGATIVE
Protein, ur: NEGATIVE mg/dL
RBC / HPF: NONE SEEN RBC/hpf (ref 0–5)
Specific Gravity, Urine: 1.003 — ABNORMAL LOW (ref 1.005–1.030)
Squamous Epithelial / LPF: NONE SEEN
pH: 7 (ref 5.0–8.0)

## 2016-12-27 LAB — CHLAMYDIA/NGC RT PCR (ARMC ONLY)
Chlamydia Tr: NOT DETECTED
N gonorrhoeae: NOT DETECTED

## 2016-12-27 MED ORDER — CEFTRIAXONE SODIUM 250 MG IJ SOLR
INTRAMUSCULAR | Status: AC
  Filled 2016-12-27: qty 250

## 2016-12-27 MED ORDER — METRONIDAZOLE 500 MG PO TABS: 2000.0000 mg | ORAL_TABLET | Freq: Once | ORAL | 0 refills | Status: AC

## 2016-12-27 MED ORDER — AZITHROMYCIN 500 MG PO TABS
1000.0000 mg | ORAL_TABLET | Freq: Once | ORAL | Status: AC
Start: 2016-12-27 — End: 2016-12-27
  Administered 2016-12-27: 1000 mg via ORAL
  Filled 2016-12-27: qty 2

## 2016-12-27 MED ORDER — CEFTRIAXONE SODIUM 250 MG IJ SOLR
250.0000 mg | Freq: Once | INTRAMUSCULAR | Status: AC
  Administered 2016-12-27: 250 mg via INTRAMUSCULAR
  Filled 2016-12-27: qty 250

## 2016-12-27 NOTE — ED Notes (Signed)
See triage note  States he developed some burning with urination about 4 days ago  Denies any blood in urine or discharge

## 2016-12-27 NOTE — ED Triage Notes (Signed)
States painful urination for 2 days, denies any penile discharge, states recent unprotected sex with a new partner

## 2016-12-27 NOTE — ED Provider Notes (Signed)
[facility] Emergency Department Provider Note ____________________________________________  Time seen: 1801  I have reviewed the triage vital signs and the nursing notes.  HISTORY  Chief Complaint  Dysuria  HPI Nicholas DomeKendrick E Highley Jr. is a 31 y.o. male Presents to the ED for evaluation of a 2-3 day complaint of dysuria. He denies any penile discharge, lesions, or sores. He denies any frank hematuria, retention, or pelvic pain. He does admit to a new sex partner with an unprotected encounter recently.  Past Medical History:  Diagnosis Date  . Head injury     There are no active problems to display for this patient.   Past Surgical History:  Procedure Laterality Date  . APPENDECTOMY      Prior to Admission medications   Medication Sig Start Date End Date Taking? Authorizing Provider  butalbital-acetaminophen-caffeine (FIORICET, ESGIC) [phone] MG tablet Take 1-2 tablets by mouth every 6 (six) hours as needed for headache. 12/23/16 12/23/17  Rebecka ApleyWebster, Allison P, MD  cyclobenzaprine (FLEXERIL) 5 MG tablet Take 1-2 tablets (5-10 mg total) by mouth 3 (three) times daily as needed for muscle spasms. 06/23/16   Evon SlackGaines, Thomas C, PA-C  HYDROcodone-acetaminophen (NORCO/VICODIN) 5-325 MG tablet Take 1 tablet by mouth every 4 (four) hours as needed for moderate pain. 12/24/15   Tommi RumpsSummers, Rhonda L, PA-C  ibuprofen (ADVIL,MOTRIN) 800 MG tablet Take 1 tablet (800 mg total) by mouth every 8 (eight) hours as needed. 06/23/16   Evon SlackGaines, Thomas C, PA-C  metroNIDAZOLE (FLAGYL) 500 MG tablet Take 4 tablets (2,000 mg total) by mouth once. 12/27/16 12/27/16  Severiano Utsey, Charlesetta IvoryJenise V Bacon, PA-C    Allergies Patient has no known allergies.  History reviewed. No pertinent family history.  Social History Social History  Substance Use Topics  . Smoking status: Current Every Day Smoker  . Smokeless tobacco: Never Used  . Alcohol use Yes    Review of Systems  Constitutional: Negative for  fever. Cardiovascular: Negative for chest pain. Respiratory: Negative for shortness of breath. Gastrointestinal: Negative for abdominal pain, vomiting and diarrhea. Genitourinary: Positive for dysuria. Musculoskeletal: Negative for back pain. Skin: Negative for rash. ____________________________________________  PHYSICAL EXAM:  VITAL SIGNS: ED Triage Vitals  Enc Vitals Group     BP 12/27/16 1657 138/62     Pulse Rate 12/27/16 1657 78     Resp 12/27/16 1657 16     Temp 12/27/16 1657 98.4 F (36.9 C)     Temp Source 12/27/16 1657 Oral     SpO2 12/27/16 1657 99 %     Weight 12/27/16 1657 170 lb (77.1 kg)     Height 12/27/16 1657 6\' 2"  (1.88 m)     Head Circumference --      Peak Flow --      Pain Score 12/27/16 1656 0     Pain Loc --      Pain Edu? --      Excl. in GC? --     Constitutional: Alert and oriented. Well appearing and in no distress. Head: Normocephalic and atraumatic. Cardiovascular: Normal rate, regular rhythm. Normal distal pulses. Respiratory: Normal respiratory effort. No wheezes/rales/rhonchi. GU: Musculoskeletal: Nontender with normal range of motion in all extremities.  Neurologic:  Normal gait without ataxia. Normal speech and language. No gross focal neurologic deficits are appreciated. Skin:  Skin is warm, dry and intact. No rash noted. ____________________________________________   LABS (pertinent positives/negatives) Labs Reviewed  URINALYSIS, ROUTINE W REFLEX MICROSCOPIC - Abnormal; Notable for the following:  Result Value   Color, Urine STRAW (*)    APPearance CLEAR (*)    Specific Gravity, Urine 1.003 (*)    Leukocytes, UA TRACE (*)    Bacteria, UA RARE (*)    All other components within normal limits  CHLAMYDIA/NGC RT PCR ([facility] ONLY)  ____________________________________________  PROCEDURES  Azithromycin 1g PO Rocephin 250 mg IM ____________________________________________  INITIAL IMPRESSION / ASSESSMENT AND PLAN /  ED COURSE  Patient with encounter for STD exposure. He is treated prophylactically for gonorrhea and chlamydia. He will be discharged with a prescription for metronidazole for trichomoniasis as well. He should follow-up with the health department for further testing. ____________________________________________  FINAL CLINICAL IMPRESSION(S) / ED DIAGNOSES  Final diagnoses:  Urethritis  Exposure to STD      Charlize Hathaway, Charlesetta Ivory, PA-C 12/27/16 1910    Emily Filbert, MD 12/27/16 1925

## 2016-12-27 NOTE — Discharge Instructions (Signed)
You have been treated for common sexually transmitted  infections through the ED. Take the prescription medication as directed. Avoid any sexual contact until all pills are gone and symptoms have resolved. Your partner(s) should also be tested and treated. You may call back to confirm the results of your pending test.

## 2017-06-23 ENCOUNTER — Other Ambulatory Visit: Payer: Self-pay | Admitting: Acute Care

## 2017-06-23 DIAGNOSIS — G44209 Tension-type headache, unspecified, not intractable: Secondary | ICD-10-CM

## 2017-06-30 ENCOUNTER — Ambulatory Visit
Admission: RE | Admit: 2017-06-30 | Discharge: 2017-06-30 | Disposition: A | Discharge status: Home / Self Care | Payer: Managed Care, Other (non HMO) | Source: Ambulatory Visit | Attending: Acute Care | Admitting: Acute Care

## 2017-06-30 DIAGNOSIS — G44229 Chronic tension-type headache, not intractable: Secondary | ICD-10-CM | POA: Insufficient documentation

## 2017-06-30 DIAGNOSIS — G44209 Tension-type headache, unspecified, not intractable: Secondary | ICD-10-CM

## 2017-07-18 ENCOUNTER — Emergency Department
Admission: EM | Admit: 2017-07-18 | Discharge: 2017-07-18 | Disposition: A | Discharge status: Home / Self Care | Payer: Managed Care, Other (non HMO) | Attending: Emergency Medicine | Admitting: Emergency Medicine

## 2017-07-18 ENCOUNTER — Other Ambulatory Visit: Payer: Self-pay

## 2017-07-18 ENCOUNTER — Encounter: Payer: Self-pay | Admitting: Emergency Medicine

## 2017-07-18 DIAGNOSIS — R300 Dysuria: Secondary | ICD-10-CM | POA: Insufficient documentation

## 2017-07-18 DIAGNOSIS — R369 Urethral discharge, unspecified: Secondary | ICD-10-CM

## 2017-07-18 DIAGNOSIS — Z202 Contact with and (suspected) exposure to infections with a predominantly sexual mode of transmission: Secondary | ICD-10-CM | POA: Diagnosis not present

## 2017-07-18 DIAGNOSIS — F17200 Nicotine dependence, unspecified, uncomplicated: Secondary | ICD-10-CM | POA: Diagnosis not present

## 2017-07-18 LAB — CHLAMYDIA/NGC RT PCR (ARMC ONLY)
CHLAMYDIA TR: NOT DETECTED
N gonorrhoeae: DETECTED — AB

## 2017-07-18 LAB — URINALYSIS, COMPLETE (UACMP) WITH MICROSCOPIC
BACTERIA UA: NONE SEEN
Bilirubin Urine: NEGATIVE
Glucose, UA: NEGATIVE mg/dL
Hgb urine dipstick: NEGATIVE
Ketones, ur: NEGATIVE mg/dL
NITRITE: NEGATIVE
PROTEIN: NEGATIVE mg/dL
SPECIFIC GRAVITY, URINE: 1.018 (ref 1.005–1.030)
Squamous Epithelial / LPF: NONE SEEN
pH: 6 (ref 5.0–8.0)

## 2017-07-18 MED ORDER — CEFTRIAXONE SODIUM 250 MG IJ SOLR
250.0000 mg | Freq: Once | INTRAMUSCULAR | Status: AC
  Administered 2017-07-18: 250 mg via INTRAMUSCULAR
  Filled 2017-07-18: qty 250

## 2017-07-18 MED ORDER — AZITHROMYCIN 500 MG PO TABS
1000.0000 mg | ORAL_TABLET | Freq: Once | ORAL | Status: AC
  Administered 2017-07-18: 1000 mg via ORAL
  Filled 2017-07-18: qty 2

## 2017-07-18 NOTE — ED Provider Notes (Signed)
[facility] Emergency Department Provider Note  ____________________________________________  Time seen: Approximately 6:15 PM  I have reviewed the triage vital signs and the nursing notes.   HISTORY  Chief Complaint Dysuria    HPI Nicholas DomeKendrick E Brocksmith Jr. is a 31 y.o. male who presents the emergency department complaining of dysuria and penile discharge times 2 days.  Patient has had unprotected sex within the last month.  He is unsure of any specific STD contact.  Patient denies any lesions, hematuria.  No abdominal pain.  Patient denies any testicular pain.  No other injury or complaint.  No medications for this complaint prior to arrival.  Past Medical History:  Diagnosis Date  . Head injury     There are no active problems to display for this patient.   Past Surgical History:  Procedure Laterality Date  . APPENDECTOMY      Prior to Admission medications   Medication Sig Start Date End Date Taking? Authorizing Provider  butalbital-acetaminophen-caffeine (FIORICET, ESGIC) [phone] [id number] MG tablet Take 1-2 tablets by mouth every 6 (six) hours as needed for headache. 12/23/16 12/23/17  Rebecka ApleyWebster, Allison P, MD  cyclobenzaprine (FLEXERIL) 5 MG tablet Take 1-2 tablets (5-10 mg total) by mouth 3 (three) times daily as needed for muscle spasms. 06/23/16   Evon SlackGaines, Thomas C, PA-C  HYDROcodone-acetaminophen (NORCO/VICODIN) 5-325 MG tablet Take 1 tablet by mouth every 4 (four) hours as needed for moderate pain. 12/24/15   Tommi RumpsSummers, Rhonda L, PA-C  ibuprofen (ADVIL,MOTRIN) 800 MG tablet Take 1 tablet (800 mg total) by mouth every 8 (eight) hours as needed. 06/23/16   Evon SlackGaines, Thomas C, PA-C    Allergies Patient has no known allergies.  No family history on file.  Social History Social History   Tobacco Use  . Smoking status: Current Every Day Smoker  . Smokeless tobacco: Never Used  Substance Use Topics  . Alcohol use: Yes  . Drug use: Yes    Types: Marijuana      Review of Systems  Constitutional: No fever/chills Eyes: No visual changes.  Cardiovascular: no chest pain. Respiratory: no cough. No SOB. Gastrointestinal: No abdominal pain.  No nausea, no vomiting.  No diarrhea.  No constipation. Genitourinary: Positive for dysuria and penile discharge. No hematuria Musculoskeletal: Negative for musculoskeletal pain. Skin: Negative for rash, abrasions, lacerations, ecchymosis. Neurological: Negative for headaches, focal weakness or numbness. 10-point ROS otherwise negative.  ____________________________________________   PHYSICAL EXAM:  VITAL SIGNS: ED Triage Vitals  Enc Vitals Group     BP 07/18/17 1621 126/74     Pulse Rate 07/18/17 1621 66     Resp 07/18/17 1621 16     Temp 07/18/17 1621 98.3 F (36.8 C)     Temp Source 07/18/17 1621 Oral     SpO2 07/18/17 1621 97 %     Weight 07/18/17 1614 170 lb (77.1 kg)     Height 07/18/17 1614 6\' 2"  (1.88 m)     Head Circumference --      Peak Flow --      Pain Score 07/18/17 1656 8     Pain Loc --      Pain Edu? --      Excl. in GC? --      Constitutional: Alert and oriented. Well appearing and in no acute distress. Eyes: Conjunctivae are normal. PERRL. EOMI. Head: Atraumatic. Neck: No stridor.    Cardiovascular: Normal rate, regular rhythm. Normal S1 and S2.  Good peripheral circulation. Respiratory: Normal respiratory effort without  tachypnea or retractions. Lungs CTAB. Good air entry to the bases with no decreased or absent breath sounds. Gastrointestinal: Bowel sounds 4 quadrants. Soft and nontender to palpation. No guarding or rigidity. No palpable masses. No distention. No CVA tenderness. Musculoskeletal: Full range of motion to all extremities. No gross deformities appreciated. Neurologic:  Normal speech and language. No gross focal neurologic deficits are appreciated.  Skin:  Skin is warm, dry and intact. No rash noted. Psychiatric: Mood and affect are normal. Speech and  behavior are normal. Patient exhibits appropriate insight and judgement.   ____________________________________________   LABS (all labs ordered are listed, but only abnormal results are displayed)  Labs Reviewed  URINALYSIS, COMPLETE (UACMP) WITH MICROSCOPIC - Abnormal; Notable for the following components:      Result Value   Color, Urine YELLOW (*)    APPearance HAZY (*)    Leukocytes, UA MODERATE (*)    All other components within normal limits  CHLAMYDIA/NGC RT PCR ([facility] ONLY)   ____________________________________________  EKG   ____________________________________________  RADIOLOGY   No results found.  ____________________________________________    PROCEDURES  Procedure(s) performed:    Procedures    Medications  cefTRIAXone (ROCEPHIN) injection 250 mg (not administered)  azithromycin (ZITHROMAX) tablet 1,000 mg (not administered)     ____________________________________________   INITIAL IMPRESSION / ASSESSMENT AND PLAN / ED COURSE  Pertinent labs & imaging results that were available during my care of the patient were reviewed by me and considered in my medical decision making (see chart for details).  Review of the [place] CSRS was performed in accordance of the [facility] prior to dispensing any controlled drugs.     Patient's diagnosis is consistent with dysuria, likely STD.  Patient presents with dysuria and penile discharge for 2 days.  Symptoms are most consistent with STD.  Patient does not want to wait until gonorrhea and Chlamydia return.  At this time, patient will be treated empirically with Rocephin and Zithromax.  If results return positive, we will call patient and he will follow-up with health department in 3 weeks for repeat testing to ensure clearance.  No medications prescribed at discharge.  Patient will follow with primary care or health department as needed.. Patient is given ED precautions to return to the ED for any  worsening or new symptoms.     ____________________________________________  FINAL CLINICAL IMPRESSION(S) / ED DIAGNOSES  Final diagnoses:  Dysuria  Penile discharge      NEW MEDICATIONS STARTED DURING THIS VISIT:  ED Discharge Orders    None          This chart was dictated using voice recognition software/Dragon. Despite best efforts to proofread, errors can occur which can change the meaning. Any change was purely unintentional.    Racheal PatchesCuthriell, Keianna Signer D, PA-C 07/18/17 [id number]    Malinda, Paul F, MD 07/18/17 [phone]

## 2017-07-18 NOTE — ED Triage Notes (Signed)
ARrives with C/O burning with urination and penile discharge x 2 days.

## 2017-07-21 ENCOUNTER — Telehealth: Payer: Self-pay | Admitting: Emergency Medicine

## 2017-07-21 NOTE — Telephone Encounter (Addendum)
Called patient to inform of std test results.  Was treated in the ED. Left message asking him to call  Me.  Patient called me back.  Explained result and need for partner treatment as well as free std treatment available at [facility].

## 2017-09-14 ENCOUNTER — Other Ambulatory Visit: Payer: Self-pay

## 2017-09-14 ENCOUNTER — Emergency Department: Payer: Managed Care, Other (non HMO)

## 2017-09-14 ENCOUNTER — Encounter: Payer: Self-pay | Admitting: Emergency Medicine

## 2017-09-14 ENCOUNTER — Emergency Department
Admission: EM | Admit: 2017-09-14 | Discharge: 2017-09-14 | Disposition: A | Discharge status: Home / Self Care | Payer: Managed Care, Other (non HMO) | Attending: Emergency Medicine | Admitting: Emergency Medicine

## 2017-09-14 DIAGNOSIS — Z79899 Other long term (current) drug therapy: Secondary | ICD-10-CM | POA: Diagnosis not present

## 2017-09-14 DIAGNOSIS — S0990XA Unspecified injury of head, initial encounter: Secondary | ICD-10-CM | POA: Diagnosis present

## 2017-09-14 DIAGNOSIS — S20212A Contusion of left front wall of thorax, initial encounter: Secondary | ICD-10-CM | POA: Diagnosis not present

## 2017-09-14 DIAGNOSIS — Y92410 Unspecified street and highway as the place of occurrence of the external cause: Secondary | ICD-10-CM | POA: Insufficient documentation

## 2017-09-14 DIAGNOSIS — Y9389 Activity, other specified: Secondary | ICD-10-CM | POA: Insufficient documentation

## 2017-09-14 DIAGNOSIS — F17200 Nicotine dependence, unspecified, uncomplicated: Secondary | ICD-10-CM | POA: Insufficient documentation

## 2017-09-14 DIAGNOSIS — Y999 Unspecified external cause status: Secondary | ICD-10-CM | POA: Diagnosis not present

## 2017-09-14 DIAGNOSIS — S0083XA Contusion of other part of head, initial encounter: Secondary | ICD-10-CM | POA: Insufficient documentation

## 2017-09-14 DIAGNOSIS — M7918 Myalgia, other site: Secondary | ICD-10-CM

## 2017-09-14 MED ORDER — IBUPROFEN 600 MG PO TABS: 600.0000 mg | ORAL_TABLET | Freq: Three times a day (TID) | ORAL | 0 refills | Status: DC | PRN

## 2017-09-14 MED ORDER — CYCLOBENZAPRINE HCL 10 MG PO TABS: 10.0000 mg | ORAL_TABLET | Freq: Three times a day (TID) | ORAL | 0 refills | Status: DC | PRN

## 2017-09-14 MED ORDER — TRAMADOL HCL 50 MG PO TABS: 50.0000 mg | ORAL_TABLET | Freq: Four times a day (QID) | ORAL | 0 refills | Status: DC | PRN

## 2017-09-14 MED ORDER — CYCLOBENZAPRINE HCL 10 MG PO TABS
10.0000 mg | ORAL_TABLET | Freq: Once | ORAL | Status: AC
  Administered 2017-09-14: 10 mg via ORAL
  Filled 2017-09-14: qty 1

## 2017-09-14 MED ORDER — TRAMADOL HCL 50 MG PO TABS
50.0000 mg | ORAL_TABLET | Freq: Once | ORAL | Status: AC
  Administered 2017-09-14: 50 mg via ORAL
  Filled 2017-09-14: qty 1

## 2017-09-14 MED ORDER — IBUPROFEN 800 MG PO TABS
800.0000 mg | ORAL_TABLET | Freq: Once | ORAL | Status: AC
  Administered 2017-09-14: 800 mg via ORAL
  Filled 2017-09-14: qty 1
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
## 2017-10-08 ENCOUNTER — Other Ambulatory Visit: Payer: Self-pay | Admitting: Chiropractor

## 2017-10-08 ENCOUNTER — Ambulatory Visit
Admission: RE | Admit: 2017-10-08 | Discharge: 2017-10-08 | Disposition: A | Discharge status: Home / Self Care | Payer: Managed Care, Other (non HMO) | Source: Ambulatory Visit | Attending: Chiropractor | Admitting: Chiropractor

## 2017-10-08 ENCOUNTER — Encounter: Payer: Self-pay | Admitting: Chiropractor

## 2017-10-08 DIAGNOSIS — M25531 Pain in right wrist: Secondary | ICD-10-CM | POA: Diagnosis present

## 2017-10-08 DIAGNOSIS — M7989 Other specified soft tissue disorders: Secondary | ICD-10-CM | POA: Diagnosis not present

## 2017-10-08 DIAGNOSIS — F17200 Nicotine dependence, unspecified, uncomplicated: Secondary | ICD-10-CM | POA: Insufficient documentation

## 2017-10-22 ENCOUNTER — Emergency Department
Admission: EM | Admit: 2017-10-22 | Discharge: 2017-10-22 | Disposition: A | Discharge status: Home / Self Care | Payer: Managed Care, Other (non HMO) | Attending: Emergency Medicine | Admitting: Emergency Medicine

## 2017-10-22 ENCOUNTER — Encounter: Payer: Self-pay | Admitting: Emergency Medicine

## 2017-10-22 ENCOUNTER — Other Ambulatory Visit: Payer: Self-pay

## 2017-10-22 DIAGNOSIS — R1010 Upper abdominal pain, unspecified: Secondary | ICD-10-CM

## 2017-10-22 DIAGNOSIS — F17210 Nicotine dependence, cigarettes, uncomplicated: Secondary | ICD-10-CM | POA: Insufficient documentation

## 2017-10-22 DIAGNOSIS — R1013 Epigastric pain: Secondary | ICD-10-CM | POA: Diagnosis not present

## 2017-10-22 LAB — COMPREHENSIVE METABOLIC PANEL
ALK PHOS: 46 U/L (ref 38–126)
ALT: 11 U/L — AB (ref 17–63)
ANION GAP: 9 (ref 5–15)
AST: 18 U/L (ref 15–41)
Albumin: 5.1 g/dL — ABNORMAL HIGH (ref 3.5–5.0)
BILIRUBIN TOTAL: 1.3 mg/dL — AB (ref 0.3–1.2)
BUN: 9 mg/dL (ref 6–20)
CALCIUM: 9.6 mg/dL (ref 8.9–10.3)
CO2: 28 mmol/L (ref 22–32)
CREATININE: 1.07 mg/dL (ref 0.61–1.24)
Chloride: 100 mmol/L — ABNORMAL LOW (ref 101–111)
Glucose, Bld: 109 mg/dL — ABNORMAL HIGH (ref 65–99)
Potassium: 4.2 mmol/L (ref 3.5–5.1)
Sodium: 137 mmol/L (ref 135–145)
TOTAL PROTEIN: 8.2 g/dL — AB (ref 6.5–8.1)

## 2017-10-22 LAB — CBC
HCT: 43.9 % (ref 40.0–52.0)
HEMOGLOBIN: 14.5 g/dL (ref 13.0–18.0)
MCH: 28.3 pg (ref 26.0–34.0)
MCHC: 33 g/dL (ref 32.0–36.0)
MCV: 85.7 fL (ref 80.0–100.0)
PLATELETS: 244 10*3/uL (ref 150–440)
RBC: 5.13 MIL/uL (ref 4.40–5.90)
RDW: 11.8 % (ref 11.5–14.5)
WBC: 8.6 10*3/uL (ref 3.8–10.6)

## 2017-10-22 LAB — URINALYSIS, COMPLETE (UACMP) WITH MICROSCOPIC
Bacteria, UA: NONE SEEN
Bilirubin Urine: NEGATIVE
GLUCOSE, UA: NEGATIVE mg/dL
HGB URINE DIPSTICK: NEGATIVE
KETONES UR: NEGATIVE mg/dL
Leukocytes, UA: NEGATIVE
NITRITE: NEGATIVE
Protein, ur: NEGATIVE mg/dL
Specific Gravity, Urine: 1.019 (ref 1.005–1.030)
Squamous Epithelial / LPF: NONE SEEN
pH: 8 (ref 5.0–8.0)

## 2017-10-22 LAB — LIPASE, BLOOD: Lipase: 28 U/L (ref 11–51)

## 2017-10-22 MED ORDER — OMEPRAZOLE 40 MG PO CPDR: 40.0000 mg | DELAYED_RELEASE_CAPSULE | Freq: Every day | ORAL | 1 refills | Status: DC

## 2017-10-22 MED ORDER — GI COCKTAIL ~~LOC~~
30.0000 mL | Freq: Once | ORAL | Status: AC
  Administered 2017-10-22: 30 mL via ORAL
  Filled 2017-10-22: qty 30

## 2017-10-22 NOTE — ED Notes (Signed)
FN: pt presents with abd pain and strong smell of marijuana.

## 2017-10-22 NOTE — ED Provider Notes (Signed)
[facility] Emergency Department Provider Note   ____________________________________________   First MD Initiated Contact with Patient 10/22/17 1440     (approximate)  I have reviewed the triage vital signs and the nursing notes.   HISTORY  Chief Complaint Abdominal Pain    HPI Nicholas Robles. is a 32 y.o. male Who reports past history of gastritis. He has been having upper abdominal pain especially in the epigastric area for the last 2 weeks. He reports eating and drinking can make it worse and not eating also makes it worse. pain is not severe but is uncomfortable.he is not having any nausea vomiting or diarrhea. He does not have a fever. He does not have a cough.   Past Medical History:  Diagnosis Date  . Head injury     There are no active problems to display for this patient.   Past Surgical History:  Procedure Laterality Date  . APPENDECTOMY      Prior to Admission medications   Medication Sig Start Date End Date Taking? Authorizing Provider  cyclobenzaprine (FLEXERIL) 10 MG tablet Take 1 tablet (10 mg total) by mouth 3 (three) times daily as needed. 09/14/17   Joni Reining, PA-C  ibuprofen (ADVIL,MOTRIN) 600 MG tablet Take 1 tablet (600 mg total) by mouth every 8 (eight) hours as needed. 09/14/17   Joni Reining, PA-C  omeprazole (PRILOSEC) 40 MG capsule Take 1 capsule (40 mg total) by mouth daily. 10/22/17 10/22/18  Arnaldo Natal, MD  traMADol (ULTRAM) 50 MG tablet Take 1 tablet (50 mg total) by mouth every 6 (six) hours as needed. 09/14/17 09/14/18  Joni Reining, PA-C    Allergies Patient has no known allergies.  History reviewed. No pertinent family history.  Social History Social History   Tobacco Use  . Smoking status: Current Some Day Smoker    Types: Cigarettes  . Smokeless tobacco: Never Used  Substance Use Topics  . Alcohol use: Yes  . Drug use: Yes    Types: Marijuana    Review of  Systems  Constitutional: No fever/chills Eyes: No visual changes. ENT: No sore throat. Cardiovascular: Denies chest pain. Respiratory: Denies shortness of breath. Gastrointestinal:see history of present illness Genitourinary: Negative for dysuria. Musculoskeletal: Negative for back pain. Skin: Negative for rash. Neurological: Negative for headaches, focal weakness  ____________________________________________   PHYSICAL EXAM:  VITAL SIGNS: ED Triage Vitals  Enc Vitals Group     BP 10/22/17 1141 127/79     Pulse Rate 10/22/17 1141 89     Resp 10/22/17 1141 17     Temp 10/22/17 1141 97.8 F (36.6 C)     Temp Source 10/22/17 1141 Oral     SpO2 10/22/17 1141 100 %     Weight 10/22/17 1141 158 lb (71.7 kg)     Height 10/22/17 1141 6\' 2"  (1.88 m)     Head Circumference --      Peak Flow --      Pain Score 10/22/17 1153 10     Pain Loc --      Pain Edu? --      Excl. in GC? --     Constitutional: Alert and oriented. Well appearing and in no acute distress. Eyes: Conjunctivae are normal.  Head: Atraumatic. Nose: No congestion/rhinnorhea. Mouth/Throat: Mucous membranes are moist.  Oropharynx non-erythematous. Neck: No stridor.  Cardiovascular: Normal rate, regular rhythm. Grossly normal heart sounds.  Good peripheral circulation. Respiratory: Normal respiratory effort.  No retractions.  Lungs CTAB. Gastrointestinal: Soft and nontenderexcept in the epigastric area immediately surrounding that.. No distention. No abdominal bruits. No CVA tenderness. Musculoskeletal: No lower extremity tenderness nor edema.  No joint effusions. Neurologic:  Normal speech and language. No gross focal neurologic deficits are appreciated. No gait instability. Skin:  Skin is warm, dry and intact. No rash noted. Psychiatric: Mood and affect are normal. Speech and behavior are normal.  ____________________________________________   LABS (all labs ordered are listed, but only abnormal results are  displayed)  Labs Reviewed  COMPREHENSIVE METABOLIC PANEL - Abnormal; Notable for the following components:      Result Value   Chloride 100 (*)    Glucose, Bld 109 (*)    Total Protein 8.2 (*)    Albumin 5.1 (*)    ALT 11 (*)    Total Bilirubin 1.3 (*)    All other components within normal limits  URINALYSIS, COMPLETE (UACMP) WITH MICROSCOPIC - Abnormal; Notable for the following components:   Color, Urine YELLOW (*)    APPearance CLEAR (*)    All other components within normal limits  LIPASE, BLOOD  CBC   ____________________________________________  EKG   ____________________________________________  RADIOLOGY  ED MD interpretation:   Official radiology report(s): No results found.  ____________________________________________   PROCEDURES  Procedure(s) performed: patient given GI cocktail. Pain resolves. Pain on palpation also resolved. I will let him go with a prescription for some Prilosec.  Procedures  Critical Care performed:   ____________________________________________   INITIAL IMPRESSION / ASSESSMENT AND PLAN / ED COURSE patient seems to have gastritis again. He agrees with my treatment.         ____________________________________________   FINAL CLINICAL IMPRESSION(S) / ED DIAGNOSES  Final diagnoses:  Pain of upper abdomen     ED Discharge Orders        Ordered    omeprazole (PRILOSEC) 40 MG capsule  Daily     10/22/17 1514       Note:  This document was prepared using Dragon voice recognition software and may include unintentional dictation errors.    Arnaldo NatalMalinda, Paul F, MD 10/22/17 [id number]

## 2017-10-22 NOTE — Discharge Instructions (Signed)
I think you may have gastritis. This is an irritation of the stomach lining. I will give you some acid blocking pills to help. Please return for worse pain fever vomiting or feeling sicker. If the prescription is too expensive for you please have the pharmacy call us while you're there.we can try and find something cheaper.

## 2017-10-22 NOTE — ED Notes (Signed)
signature pad not working. Pt verbalized understanding of discharge and follow up instructions.

## 2017-10-22 NOTE — ED Triage Notes (Signed)
Pt in via POV with complaints of intermittent upper abdominal pain x 2 weeks, reports some nausea, denies any vomiting/diarrhea.  Pt reports hx of gastritis.  Vitals WDL, NAD noted at this time.

## 2018-05-12 ENCOUNTER — Emergency Department (HOSPITAL_COMMUNITY)
Admission: EM | Admit: 2018-05-12 | Discharge: 2018-05-12 | Disposition: A | Discharge status: Home / Self Care | Payer: Managed Care, Other (non HMO) | Attending: Emergency Medicine | Admitting: Emergency Medicine

## 2018-05-12 ENCOUNTER — Encounter (HOSPITAL_COMMUNITY): Payer: Self-pay | Admitting: Emergency Medicine

## 2018-05-12 ENCOUNTER — Other Ambulatory Visit: Payer: Self-pay

## 2018-05-12 DIAGNOSIS — Z87891 Personal history of nicotine dependence: Secondary | ICD-10-CM | POA: Insufficient documentation

## 2018-05-12 DIAGNOSIS — R0789 Other chest pain: Secondary | ICD-10-CM | POA: Insufficient documentation

## 2018-05-12 DIAGNOSIS — S0101XA Laceration without foreign body of scalp, initial encounter: Secondary | ICD-10-CM

## 2018-05-12 DIAGNOSIS — S61411A Laceration without foreign body of right hand, initial encounter: Secondary | ICD-10-CM

## 2018-05-12 DIAGNOSIS — R112 Nausea with vomiting, unspecified: Secondary | ICD-10-CM | POA: Insufficient documentation

## 2018-05-12 DIAGNOSIS — Z79899 Other long term (current) drug therapy: Secondary | ICD-10-CM | POA: Insufficient documentation

## 2018-05-12 LAB — CBC
HEMATOCRIT: 42.3 % (ref 39.0–52.0)
HEMOGLOBIN: 13.9 g/dL (ref 13.0–17.0)
MCH: 28.6 pg (ref 26.0–34.0)
MCHC: 32.9 g/dL (ref 30.0–36.0)
MCV: 87 fL (ref 78.0–100.0)
Platelets: 199 10*3/uL (ref 150–400)
RBC: 4.86 MIL/uL (ref 4.22–5.81)
RDW: 11.1 % — ABNORMAL LOW (ref 11.5–15.5)
WBC: 17.1 10*3/uL — AB (ref 4.0–10.5)

## 2018-05-12 LAB — BASIC METABOLIC PANEL
ANION GAP: 10 (ref 5–15)
BUN: 10 mg/dL (ref 6–20)
CALCIUM: 9.3 mg/dL (ref 8.9–10.3)
CO2: 23 mmol/L (ref 22–32)
Chloride: 106 mmol/L (ref 98–111)
Creatinine, Ser: 1.33 mg/dL — ABNORMAL HIGH (ref 0.61–1.24)
Glucose, Bld: 95 mg/dL (ref 70–99)
POTASSIUM: 3.3 mmol/L — AB (ref 3.5–5.1)
SODIUM: 139 mmol/L (ref 135–145)

## 2018-05-12 MED ORDER — LIDOCAINE-EPINEPHRINE (PF) 2 %-1:200000 IJ SOLN
20.0000 mL | Freq: Once | INTRAMUSCULAR | Status: AC
  Administered 2018-05-12: 20 mL via INTRADERMAL
  Filled 2018-05-12: qty 20

## 2018-05-12 MED ORDER — KETOROLAC TROMETHAMINE 60 MG/2ML IM SOLN
15.0000 mg | Freq: Once | INTRAMUSCULAR | Status: AC
  Administered 2018-05-12: 15 mg via INTRAMUSCULAR
  Filled 2018-05-12: qty 2

## 2018-05-12 MED ORDER — ACETAMINOPHEN 500 MG PO TABS
1000.0000 mg | ORAL_TABLET | Freq: Once | ORAL | Status: AC
  Administered 2018-05-12: 1000 mg via ORAL
  Filled 2018-05-12: qty 2

## 2018-05-12 MED ORDER — AMOXICILLIN-POT CLAVULANATE 875-125 MG PO TABS: 1.0000 | ORAL_TABLET | Freq: Two times a day (BID) | ORAL | 0 refills | Status: DC

## 2018-05-12 MED ORDER — OXYCODONE HCL 5 MG PO TABS
5.0000 mg | ORAL_TABLET | Freq: Once | ORAL | Status: AC
  Administered 2018-05-12: 5 mg via ORAL
  Filled 2018-05-12: qty 1

## 2018-05-12 NOTE — ED Notes (Signed)
Grandmother called to check on patient.

## 2018-05-12 NOTE — ED Notes (Signed)
Pt remains in [facility].  Updated on wait for treatment room.  Pt notified to call Grandmother.

## 2018-05-12 NOTE — ED Notes (Signed)
Patient verbalizes understanding of discharge instructions. Opportunity for questioning and answers were provided. Armband removed by staff, pt discharged from ED.  
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
## 2018-06-09 ENCOUNTER — Emergency Department: Payer: Self-pay

## 2018-06-09 ENCOUNTER — Encounter: Payer: Self-pay | Admitting: Emergency Medicine

## 2018-06-09 ENCOUNTER — Other Ambulatory Visit: Payer: Self-pay

## 2018-06-09 ENCOUNTER — Emergency Department
Admission: EM | Admit: 2018-06-09 | Discharge: 2018-06-09 | Disposition: A | Discharge status: Home / Self Care | Payer: Self-pay | Attending: Emergency Medicine | Admitting: Emergency Medicine

## 2018-06-09 DIAGNOSIS — K2900 Acute gastritis without bleeding: Secondary | ICD-10-CM | POA: Insufficient documentation

## 2018-06-09 DIAGNOSIS — A749 Chlamydial infection, unspecified: Secondary | ICD-10-CM | POA: Insufficient documentation

## 2018-06-09 DIAGNOSIS — Z79899 Other long term (current) drug therapy: Secondary | ICD-10-CM | POA: Insufficient documentation

## 2018-06-09 DIAGNOSIS — R1013 Epigastric pain: Secondary | ICD-10-CM

## 2018-06-09 DIAGNOSIS — F17210 Nicotine dependence, cigarettes, uncomplicated: Secondary | ICD-10-CM | POA: Insufficient documentation

## 2018-06-09 LAB — URINALYSIS, COMPLETE (UACMP) WITH MICROSCOPIC
Bacteria, UA: NONE SEEN
Bilirubin Urine: NEGATIVE
GLUCOSE, UA: NEGATIVE mg/dL
HGB URINE DIPSTICK: NEGATIVE
Ketones, ur: NEGATIVE mg/dL
Leukocytes, UA: NEGATIVE
Nitrite: NEGATIVE
Protein, ur: NEGATIVE mg/dL
SQUAMOUS EPITHELIAL / LPF: NONE SEEN (ref 0–5)
Specific Gravity, Urine: 1.014 (ref 1.005–1.030)
pH: 6 (ref 5.0–8.0)

## 2018-06-09 LAB — CBC
HCT: 39.6 % (ref 39.0–52.0)
Hemoglobin: 13.3 g/dL (ref 13.0–17.0)
MCH: 28.7 pg (ref 26.0–34.0)
MCHC: 33.6 g/dL (ref 30.0–36.0)
MCV: 85.5 fL (ref 80.0–100.0)
NRBC: 0 % (ref 0.0–0.2)
PLATELETS: 202 10*3/uL (ref 150–400)
RBC: 4.63 MIL/uL (ref 4.22–5.81)
RDW: 11 % — AB (ref 11.5–15.5)
WBC: 9.5 10*3/uL (ref 4.0–10.5)

## 2018-06-09 LAB — COMPREHENSIVE METABOLIC PANEL
ALK PHOS: 42 U/L (ref 38–126)
ALT: 14 U/L (ref 0–44)
ANION GAP: 6 (ref 5–15)
AST: 20 U/L (ref 15–41)
Albumin: 4.4 g/dL (ref 3.5–5.0)
BILIRUBIN TOTAL: 0.5 mg/dL (ref 0.3–1.2)
BUN: 10 mg/dL (ref 6–20)
CALCIUM: 9.3 mg/dL (ref 8.9–10.3)
CO2: 28 mmol/L (ref 22–32)
CREATININE: 1.14 mg/dL (ref 0.61–1.24)
Chloride: 103 mmol/L (ref 98–111)
GFR calc non Af Amer: >60 mL/min (ref >60)
GLUCOSE: 128 mg/dL — AB (ref 70–99)
Potassium: 3.7 mmol/L (ref 3.5–5.1)
SODIUM: 137 mmol/L (ref 135–145)
TOTAL PROTEIN: 7 g/dL (ref 6.5–8.1)

## 2018-06-09 LAB — CHLAMYDIA/NGC RT PCR (ARMC ONLY)
Chlamydia Tr: DETECTED — AB
N gonorrhoeae: NOT DETECTED

## 2018-06-09 LAB — TROPONIN I
Troponin I: <0.03 ng/mL (ref <0.03)
Troponin I: <0.03 ng/mL (ref <0.03)

## 2018-06-09 LAB — LIPASE, BLOOD: Lipase: 25 U/L (ref 11–51)

## 2018-06-09 MED ORDER — FAMOTIDINE IN NACL 20-0.9 MG/50ML-% IV SOLN
INTRAVENOUS | Status: AC
  Administered 2018-06-09: 20 mg via INTRAVENOUS
  Filled 2018-06-09: qty 50

## 2018-06-09 MED ORDER — ALUM & MAG HYDROXIDE-SIMETH 200-200-20 MG/5ML PO SUSP
30.0000 mL | Freq: Once | ORAL | Status: AC
  Administered 2018-06-09: 30 mL via ORAL

## 2018-06-09 MED ORDER — LIDOCAINE VISCOUS HCL 2 % MT SOLN
OROMUCOSAL | Status: AC
  Administered 2018-06-09: 15 mL via ORAL
  Filled 2018-06-09: qty 15

## 2018-06-09 MED ORDER — FAMOTIDINE 40 MG PO TABS: 40.0000 mg | ORAL_TABLET | Freq: Every day | ORAL | 1 refills | Status: DC

## 2018-06-09 MED ORDER — ONDANSETRON HCL 4 MG/2ML IJ SOLN
4.0000 mg | Freq: Once | INTRAMUSCULAR | Status: AC
  Administered 2018-06-09: 4 mg via INTRAVENOUS
  Filled 2018-06-09: qty 2

## 2018-06-09 MED ORDER — ONDANSETRON 4 MG PO TBDP: 4.0000 mg | ORAL_TABLET | Freq: Three times a day (TID) | ORAL | 0 refills | Status: DC | PRN

## 2018-06-09 MED ORDER — LIDOCAINE VISCOUS HCL 2 % MT SOLN
15.0000 mL | Freq: Once | OROMUCOSAL | Status: AC
  Administered 2018-06-09: 15 mL via ORAL

## 2018-06-09 MED ORDER — ALUM & MAG HYDROXIDE-SIMETH 200-200-20 MG/5ML PO SUSP
ORAL | Status: AC
  Administered 2018-06-09: 30 mL via ORAL
  Filled 2018-06-09: qty 30

## 2018-06-09 MED ORDER — FAMOTIDINE IN NACL 20-0.9 MG/50ML-% IV SOLN
20.0000 mg | Freq: Once | INTRAVENOUS | Status: AC
  Administered 2018-06-09: 20 mg via INTRAVENOUS

## 2018-06-09 NOTE — ED Provider Notes (Signed)
[facility] Emergency Department Provider Note  ____________________________________________  Time seen: Approximately 8:35 PM  I have reviewed the triage vital signs and the nursing notes.   HISTORY  Chief Complaint Abdominal Pain   HPI Nicholas Chait. is a 32 y.o. male with a history of reflux who presents for evaluation of abdominal pain x3 days.  Patient reports history of gastritis which feels the same.  He reports that he ran out of Pepcid which usually keeps his symptoms under control.  He has had nausea and decreased p.o. intake but no vomiting.  He reports that the pain is burning, located in the epigastric region, constant and nonradiating. Patient reports that the pain started after he had unprotected sex and is concerned about STDs. No penile discharge or testicular pain.  No fever or chills.  Has had appendectomy but no other abdominal surgeries.   no chest pain or shortness of breath.  Today had one episode of loose stool with no melena or blood.  Past Medical History:  Diagnosis Date  . Head injury     There are no active problems to display for this patient.   Past Surgical History:  Procedure Laterality Date  . APPENDECTOMY      Prior to Admission medications   Medication Sig Start Date End Date Taking? Authorizing Provider  amoxicillin-clavulanate (AUGMENTIN) 875-125 MG tablet Take 1 tablet by mouth 2 (two) times daily. 05/12/18   Melene Plan, DO  famotidine (PEPCID) 40 MG tablet Take 1 tablet (40 mg total) by mouth daily. 06/09/18 06/09/19  Nita Sickle, MD  omeprazole (PRILOSEC) 40 MG capsule Take 1 capsule (40 mg total) by mouth daily. 10/22/17 10/22/18  Arnaldo Natal, MD  ondansetron (ZOFRAN ODT) 4 MG disintegrating tablet Take 1 tablet (4 mg total) by mouth every 8 (eight) hours as needed for nausea or vomiting. 06/09/18   Nita Sickle, MD    Allergies Patient has no known allergies.  No family history on  file.  Social History Social History   Tobacco Use  . Smoking status: Current Some Day Smoker    Types: Cigarettes  . Smokeless tobacco: Never Used  Substance Use Topics  . Alcohol use: Yes  . Drug use: Yes    Types: Marijuana    Review of Systems  Constitutional: Negative for fever. Eyes: Negative for visual changes. ENT: Negative for sore throat. Neck: No neck pain  Cardiovascular: Negative for chest pain. Respiratory: Negative for shortness of breath. Gastrointestinal: + epigastric abdominal pain and nausea. No vomiting or diarrhea. Genitourinary: Negative for dysuria. Musculoskeletal: Negative for back pain. Skin: Negative for rash. Neurological: Negative for headaches, weakness or numbness. Psych: No SI or HI  ____________________________________________   PHYSICAL EXAM:  VITAL SIGNS: ED Triage Vitals [06/09/18 1910]  Enc Vitals Group     BP 112/72     Pulse Rate 75     Resp 18     Temp 98.3 F (36.8 C)     Temp Source Oral     SpO2 97 %     Weight 160 lb (72.6 kg)     Height 6\' 1"  (1.854 m)     Head Circumference      Peak Flow      Pain Score 10     Pain Loc      Pain Edu?      Excl. in GC?     Constitutional: Alert and oriented. Well appearing and in no apparent distress. HEENT:  Head: Normocephalic and atraumatic.         Eyes: Conjunctivae are normal. Sclera is non-icteric.       Mouth/Throat: Mucous membranes are moist.       Neck: Supple with no signs of meningismus. Cardiovascular: Regular rate and rhythm. No murmurs, gallops, or rubs. 2+ symmetrical distal pulses are present in all extremities. No JVD. Respiratory: Normal respiratory effort. Lungs are clear to auscultation bilaterally. No wheezes, crackles, or rhonchi.  Gastrointestinal: Soft, non tender, and non distended with positive bowel sounds. No rebound or guarding. Genitourinary: No CVA tenderness. Musculoskeletal: Nontender with normal range of motion in all extremities. No  edema, cyanosis, or erythema of extremities. Neurologic: Normal speech and language. Face is symmetric. Moving all extremities. No gross focal neurologic deficits are appreciated. Skin: Skin is warm, dry and intact. No rash noted. Psychiatric: Mood and affect are normal. Speech and behavior are normal.  ____________________________________________   LABS (all labs ordered are listed, but only abnormal results are displayed)  Labs Reviewed  CHLAMYDIA/NGC RT PCR ([facility] ONLY) - Abnormal; Notable for the following components:      Result Value   Chlamydia Tr DETECTED (*)    All other components within normal limits  COMPREHENSIVE METABOLIC PANEL - Abnormal; Notable for the following components:   Glucose, Bld 128 (*)    All other components within normal limits  CBC - Abnormal; Notable for the following components:   RDW 11.0 (*)    All other components within normal limits  URINALYSIS, COMPLETE (UACMP) WITH MICROSCOPIC - Abnormal; Notable for the following components:   Color, Urine YELLOW (*)    APPearance CLEAR (*)    All other components within normal limits  LIPASE, BLOOD  TROPONIN I  TROPONIN I   ____________________________________________  EKG  ED ECG REPORT I, Nita Sickle, the attending physician, personally viewed and interpreted this ECG.  Sinus bradycardia, rate of 56, normal intervals, normal axis, benign early repolarization.  Unchanged from prior. ____________________________________________  RADIOLOGY  I have personally reviewed the images performed during this visit and I agree with the Radiologist's read.   Interpretation by Radiologist:  No results found.   ____________________________________________   PROCEDURES  Procedure(s) performed: None Procedures Critical Care performed:  None ____________________________________________   INITIAL IMPRESSION / ASSESSMENT AND PLAN / ED COURSE  32 y.o. male with a history of reflux who  presents for evaluation of epigastric burning abdominal pain x3 days and decreased appetite and nausea.  Patient is well-appearing in no distress, has normal vital signs, abdomen is soft with no tenderness to palpation.  Differential diagnoses including gastritis versus peptic ulcer disease versus GERD versus pancreatitis versus gallbladder versus ACS.  Patient is also worried about STDs.  Will check gonorrhea and chlamydia on urine.  Labs including CBC, CMP and lipase are all within normal limits.  UA is negative for UTI or blood, no signs of dehydration. EKG and troponin with no acute changes. Will give pepcid, GI cocktail, and zofran  Clinical Course as of Jun 10 2308  Tue Jun 09, 2018  2207 Pain resolved. Labs and XR with no acute findings. Patient will be dc home on pepcid and zofran. Recommended f/u with GI. Gc/chlamydia pending, will call patient if positive. Discussed standard return precautions.   [CV]    Clinical Course User Index [CV] Don Perking [place], MD     As part of my medical decision making, I reviewed the following data within the electronic MEDICAL RECORD NUMBER Nursing notes reviewed  and incorporated, Labs reviewed , EKG interpreted , Old EKG reviewed, Old chart reviewed, Radiograph reviewed , Notes from prior ED visits and [place] Controlled Substance Database    Pertinent labs & imaging results that were available during my care of the patient were reviewed by me and considered in my medical decision making (see chart for details).    ____________________________________________   FINAL CLINICAL IMPRESSION(S) / ED DIAGNOSES  Final diagnoses:  Epigastric pain  Acute gastritis without hemorrhage, unspecified gastritis type  Chlamydia      NEW MEDICATIONS STARTED DURING THIS VISIT:  ED Discharge Orders         Ordered    famotidine (PEPCID) 40 MG tablet  Daily     06/09/18 2209    ondansetron (ZOFRAN ODT) 4 MG disintegrating tablet  Every 8 hours PRN     06/09/18  2209           Note:  This document was prepared using Dragon voice recognition software and may include unintentional dictation errors.    Nita Sickle, MD 06/10/18 [phone]

## 2018-06-09 NOTE — Discharge Instructions (Addendum)

## 2018-06-09 NOTE — ED Notes (Signed)
Pt ambulatory to POV without difficulty. VSS. NAD. Discharge instructions, RX and follow up reviewed. All questions and concerns were addressed.  

## 2018-06-09 NOTE — ED Triage Notes (Signed)
Patient ambulatory to triage with steady gait, without difficulty or distress noted ; pt reports generalized abd pain x 3 days with no appetite; st hx of same and dx with gastritis, rx prilosec with relief but out of med

## 2018-06-10 ENCOUNTER — Telehealth: Payer: Self-pay | Admitting: Emergency Medicine

## 2018-06-10 NOTE — Telephone Encounter (Addendum)
Pt with positive chlamydia.  Per dr Silverio Lay patient needs to be treated with azithromycin 1 gram po and call in to patient preferred pharmacy.  Left message asking patient to call me.  06/10/2018  1050--patient called me back.  Explained need for treatment and partner treatment.  Free treatment at [facility].  Called in the med to [facility] [place]
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
## 2019-03-16 ENCOUNTER — Other Ambulatory Visit: Payer: Self-pay

## 2019-03-16 ENCOUNTER — Emergency Department
Admission: EM | Admit: 2019-03-16 | Discharge: 2019-03-16 | Disposition: A | Discharge status: Home / Self Care | Payer: Self-pay | Attending: Emergency Medicine | Admitting: Emergency Medicine

## 2019-03-16 DIAGNOSIS — Z79899 Other long term (current) drug therapy: Secondary | ICD-10-CM | POA: Insufficient documentation

## 2019-03-16 DIAGNOSIS — A549 Gonococcal infection, unspecified: Secondary | ICD-10-CM | POA: Insufficient documentation

## 2019-03-16 DIAGNOSIS — F17210 Nicotine dependence, cigarettes, uncomplicated: Secondary | ICD-10-CM | POA: Insufficient documentation

## 2019-03-16 DIAGNOSIS — A749 Chlamydial infection, unspecified: Secondary | ICD-10-CM | POA: Insufficient documentation

## 2019-03-16 LAB — URINALYSIS, COMPLETE (UACMP) WITH MICROSCOPIC
Bacteria, UA: NONE SEEN
Bilirubin Urine: NEGATIVE
Glucose, UA: NEGATIVE mg/dL
Hgb urine dipstick: NEGATIVE
Ketones, ur: NEGATIVE mg/dL
Nitrite: NEGATIVE
Protein, ur: 30 mg/dL — AB
Specific Gravity, Urine: 1.023 (ref 1.005–1.030)
Squamous Epithelial / LPF: NONE SEEN (ref 0–5)
WBC, UA: >50 WBC/hpf — ABNORMAL HIGH (ref 0–5)
pH: 5 (ref 5.0–8.0)

## 2019-03-16 LAB — CHLAMYDIA/NGC RT PCR (ARMC ONLY): Chlamydia Tr: DETECTED — AB

## 2019-03-16 LAB — CHLAMYDIA/NGC RT PCR (ARMC ONLY)??????????: N gonorrhoeae: DETECTED — AB

## 2019-03-16 MED ORDER — CEFTRIAXONE SODIUM 250 MG IJ SOLR
250.0000 mg | Freq: Once | INTRAMUSCULAR | Status: AC
  Administered 2019-03-16: 250 mg via INTRAMUSCULAR
  Filled 2019-03-16: qty 250

## 2019-03-16 MED ORDER — AZITHROMYCIN 500 MG PO TABS
1000.0000 mg | ORAL_TABLET | Freq: Once | ORAL | Status: AC
  Administered 2019-03-16: 06:00:00 1000 mg via ORAL
  Filled 2019-03-16: qty 2

## 2019-03-16 NOTE — ED Notes (Signed)
MD Owens Shark with reviewing results with patient in triage room.
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
## 2019-03-16 NOTE — ED Notes (Signed)
Updated on wait time.  

## 2019-03-16 NOTE — ED Provider Notes (Signed)
[facility] Emergency Department Provider Note    First MD Initiated Contact with Patient 03/16/19 [phone]     (approximate)  I have reviewed the triage vital signs and the nursing notes.   HISTORY  Chief Complaint Penile Discharge and Dysuria   HPI Nicholas Robles. is a 33 y.o. male resents to the emergency department for 2-day history of dysuria and penile discharge.  Patient does admit to unprotected sex.  Patient does admit to previous history of STI.        Past Medical History:  Diagnosis Date  . Head injury     There are no active problems to display for this patient.   Past Surgical History:  Procedure Laterality Date  . APPENDECTOMY      Prior to Admission medications   Medication Sig Start Date End Date Taking? Authorizing Provider  amoxicillin-clavulanate (AUGMENTIN) 875-125 MG tablet Take 1 tablet by mouth 2 (two) times daily. 05/12/18   Deno Etienne, DO  famotidine (PEPCID) 40 MG tablet Take 1 tablet (40 mg total) by mouth daily. 06/09/18 06/09/19  Rudene Re, MD  omeprazole (PRILOSEC) 40 MG capsule Take 1 capsule (40 mg total) by mouth daily. 10/22/17 10/22/18  Nena Polio, MD  ondansetron (ZOFRAN ODT) 4 MG disintegrating tablet Take 1 tablet (4 mg total) by mouth every 8 (eight) hours as needed for nausea or vomiting. 06/09/18   Rudene Re, MD    Allergies Patient has no known allergies.  No family history on file.  Social History Social History   Tobacco Use  . Smoking status: Current Some Day Smoker    Types: Cigarettes  . Smokeless tobacco: Never Used  Substance Use Topics  . Alcohol use: Yes  . Drug use: Yes    Types: Marijuana    Review of Systems Constitutional: No fever/chills Eyes: No visual changes. ENT: No sore throat. Cardiovascular: Denies chest pain. Respiratory: Denies shortness of breath. Gastrointestinal: No abdominal pain.  No nausea, no vomiting.  No diarrhea.  No  constipation. Genitourinary: Positive for dysuria and penile discharge Musculoskeletal: Negative for neck pain.  Negative for back pain. Integumentary: Negative for rash. Neurological: Negative for headaches, focal weakness or numbness.   ____________________________________________   PHYSICAL EXAM:  VITAL SIGNS: ED Triage Vitals  Enc Vitals Group     BP 03/16/19 0236 (!) 126/57     Pulse Rate 03/16/19 0236 65     Resp 03/16/19 0236 18     Temp 03/16/19 0236 99.1 F (37.3 C)     Temp Source 03/16/19 0236 Oral     SpO2 03/16/19 0236 99 %     Weight 03/16/19 0235 74.8 kg (165 lb)     Height 03/16/19 0235 1.88 m (6\' 2" )     Head Circumference --      Peak Flow --      Pain Score 03/16/19 0235 8     Pain Loc --      Pain Edu? --      Excl. in [place]? --     Constitutional: Alert and oriented. Well appearing and in no acute distress. Eyes: Conjunctivae are normal.  Mouth/Throat: Mucous membranes are moist. Neck: No stridor.   Gastrointestinal: Soft and nontender. No distention.  Genitourinary: No gross abnormality Musculoskeletal: No lower extremity tenderness nor edema. No gross deformities of extremities. Neurologic:  Normal speech and language. No gross focal neurologic deficits are appreciated.  Skin:  Skin is warm, dry and intact. No rash  noted. Psychiatric: Mood and affect are normal. Speech and behavior are normal.  ____________________________________________   LABS (all labs ordered are listed, but only abnormal results are displayed)  Labs Reviewed  CHLAMYDIA/NGC RT PCR ([facility] ONLY) - Abnormal; Notable for the following components:      Result Value   Chlamydia Tr DETECTED (*)    N gonorrhoeae DETECTED (*)    All other components within normal limits  URINALYSIS, COMPLETE (UACMP) WITH MICROSCOPIC - Abnormal; Notable for the following components:   Color, Urine YELLOW (*)    APPearance CLOUDY (*)    Protein, ur 30 (*)    Leukocytes,Ua MODERATE (*)     WBC, UA >50 (*)    All other components within normal limits    Procedures   ____________________________________________   INITIAL IMPRESSION / MDM / ASSESSMENT AND PLAN / ED COURSE  As part of my medical decision making, I reviewed the following data within the electronic MEDICAL RECORD NUMBER  33 year old male presenting with above-stated history and physical exam secondary to symptoms concerning for sexually transmitted infection.  Patient positive for gonorrhea and chlamydia and as such received ceftriaxone 250 mg IM as well as azithromycin 1 g p.o.  Patient advised to notify all sexual partners of his results today and encouraged him to seek treatment.     ____________________________________________  FINAL CLINICAL IMPRESSION(S) / ED DIAGNOSES  Final diagnoses:  Gonorrhea in male  Chlamydia infection     MEDICATIONS GIVEN DURING THIS VISIT:  Medications  cefTRIAXone (ROCEPHIN) injection 250 mg (250 mg Intramuscular Given 03/16/19 0618)  azithromycin (ZITHROMAX) tablet 1,000 mg (1,000 mg Oral Given 03/16/19 [id number])     ED Discharge Orders    None      *Please note:  Nicholas DomeKendrick E Percival Jr. was evaluated in Emergency Department on 03/16/2019 for the symptoms described in the history of present illness. He was evaluated in the context of the global COVID-19 pandemic, which necessitated consideration that the patient might be at risk for infection with the SARS-CoV-2 virus that causes COVID-19. Institutional protocols and algorithms that pertain to the evaluation of patients at risk for COVID-19 are in a state of rapid change based on information released by regulatory bodies including the CDC and federal and state organizations. These policies and algorithms were followed during the patient's care in the ED.  Some ED evaluations and interventions may be delayed as a result of limited staffing during the pandemic.*  Note:  This document was prepared using Dragon voice recognition  software and may include unintentional dictation errors.   Darci CurrentBrown, Stryker N, MD 03/16/19 [phone]

## 2019-03-18 ENCOUNTER — Ambulatory Visit: Payer: Self-pay

## 2019-06-22 ENCOUNTER — Other Ambulatory Visit: Payer: Self-pay

## 2019-06-22 ENCOUNTER — Encounter: Payer: Self-pay | Admitting: Family Medicine

## 2019-06-22 ENCOUNTER — Ambulatory Visit: Payer: Self-pay | Admitting: Family Medicine

## 2019-06-22 DIAGNOSIS — Z113 Encounter for screening for infections with a predominantly sexual mode of transmission: Secondary | ICD-10-CM

## 2019-06-22 DIAGNOSIS — A5401 Gonococcal cystitis and urethritis, unspecified: Secondary | ICD-10-CM

## 2019-06-22 LAB — GRAM STAIN

## 2019-06-22 MED ORDER — CEFTRIAXONE SODIUM 250 MG IJ SOLR
250.0000 mg | Freq: Once | INTRAMUSCULAR | Status: AC
  Administered 2019-06-22: 250 mg via INTRAMUSCULAR

## 2019-06-22 MED ORDER — AZITHROMYCIN 500 MG PO TABS
1000.0000 mg | ORAL_TABLET | Freq: Once | ORAL | Status: AC
  Administered 2019-06-22: 10:00:00 1000 mg via ORAL

## 2019-06-22 NOTE — Progress Notes (Signed)
    STI clinic/screening visit  Subjective:  Nicholas Robles. is a 33 y.o. male being seen today for an STI screening visit. The patient reports they do have symptoms.  Patient has the following medical conditions:  There are no active problems to display for this patient.    Chief Complaint  Patient presents with  . SEXUALLY TRANSMITTED DISEASE    HPI  Patient reports he has had yellow disch x 5 days and began having dysuria since the weekend.  Had unprotected sex last week with new partner.  See flowsheet for further details and programmatic requirements.    The following portions of the patient's history were reviewed and updated as appropriate: allergies, current medications, past medical history, past social history, past surgical history and problem list.  Objective:  There were no vitals filed for this visit.  Physical Exam Constitutional:      Appearance: Normal appearance.  Neck:     Musculoskeletal: No muscular tenderness.  Abdominal:     Hernia: There is no hernia in the left inguinal area or right inguinal area.  Genitourinary:    Pubic Area: No rash.      Penis: Circumcised. Discharge present.      Scrotum/Testes: Normal.     Epididymis:     Right: Normal.     Left: Normal.     Comments: Small amt of yellow disch noted from urethra Lymphadenopathy:     Lower Body: Right inguinal adenopathy present. Left inguinal adenopathy present.    Assessment and Plan:  Nicholas Robles. is a 33 y.o. male presenting to the [place] for STI screening  1. Screening examination for venereal disease  - Gram stain - Gonococcus culture - Gonococcus culture  2. Gonococcal urethritis  - cefTRIAXone (ROCEPHIN) injection 250 mg - azithromycin (ZITHROMAX) tablet 1,000 mg Co. To notify partners to have evaluation and treatment of GC Co. to always use condoms for prevention of STD.     No follow-ups on file.  No future  appointments.  Hassell Done, FNP

## 2019-06-22 NOTE — Progress Notes (Addendum)
Here today for STD screening. Declines bloodwork. Hal Morales, RN Gram Stain results reviewed. Patient treated for GC per standing orders. Hal Morales, RN

## 2019-07-14 NOTE — Addendum Note (Signed)
Addended by: Caryl Bis on: 07/14/2019 01:00 PM   Modules accepted: Orders

## 2019-09-06 ENCOUNTER — Other Ambulatory Visit: Payer: Self-pay

## 2019-09-06 ENCOUNTER — Emergency Department
Admission: EM | Admit: 2019-09-06 | Discharge: 2019-09-06 | Disposition: A | Discharge status: Home / Self Care | Payer: BLUE CROSS/BLUE SHIELD | Attending: Emergency Medicine | Admitting: Emergency Medicine

## 2019-09-06 ENCOUNTER — Encounter: Payer: Self-pay | Admitting: Emergency Medicine

## 2019-09-06 DIAGNOSIS — Z202 Contact with and (suspected) exposure to infections with a predominantly sexual mode of transmission: Secondary | ICD-10-CM | POA: Diagnosis not present

## 2019-09-06 DIAGNOSIS — Z87891 Personal history of nicotine dependence: Secondary | ICD-10-CM | POA: Insufficient documentation

## 2019-09-06 DIAGNOSIS — R369 Urethral discharge, unspecified: Secondary | ICD-10-CM | POA: Insufficient documentation

## 2019-09-06 LAB — URINALYSIS, COMPLETE (UACMP) WITH MICROSCOPIC
Bacteria, UA: NONE SEEN
Bilirubin Urine: NEGATIVE
Glucose, UA: NEGATIVE mg/dL
Hgb urine dipstick: NEGATIVE
Ketones, ur: NEGATIVE mg/dL
Nitrite: NEGATIVE
Protein, ur: 30 mg/dL — AB
Specific Gravity, Urine: 1.025 (ref 1.005–1.030)
Squamous Epithelial / LPF: NONE SEEN (ref 0–5)
WBC, UA: >50 WBC/hpf — ABNORMAL HIGH (ref 0–5)
pH: 5 (ref 5.0–8.0)

## 2019-09-06 MED ORDER — LIDOCAINE HCL (PF) 1 % IJ SOLN
INTRAMUSCULAR | Status: AC
  Administered 2019-09-06: 0.9 mL
  Filled 2019-09-06: qty 5

## 2019-09-06 MED ORDER — AZITHROMYCIN 500 MG PO TABS
1000.0000 mg | ORAL_TABLET | Freq: Once | ORAL | Status: AC
  Administered 2019-09-06: 1000 mg via ORAL
  Filled 2019-09-06: qty 2

## 2019-09-06 MED ORDER — CEFTRIAXONE SODIUM 250 MG IJ SOLR
250.0000 mg | Freq: Once | INTRAMUSCULAR | Status: AC
  Administered 2019-09-06: 18:00:00 250 mg via INTRAMUSCULAR
  Filled 2019-09-06: qty 250

## 2019-09-06 MED ORDER — METRONIDAZOLE 500 MG PO TABS
2000.0000 mg | ORAL_TABLET | Freq: Once | ORAL | Status: AC
  Administered 2019-09-06: 2000 mg via ORAL
  Filled 2019-09-06: qty 4

## 2019-09-06 NOTE — ED Provider Notes (Signed)
[facility] Emergency Department Provider Note  ____________________________________________  Time seen: Approximately 5:35 PM  I have reviewed the triage vital signs and the nursing notes.   HISTORY  Chief Complaint Exposure to STD    HPI Nicholas Robles. is a 34 y.o. male that presents to theemergency department for penile discharge and possible STD exposure.  Patient states that he thinks he was exposed to an STD.  No fever, chills, abdominal pain, rash.  Past Medical History:  Diagnosis Date  . Head injury     There are no problems to display for this patient.   Past Surgical History:  Procedure Laterality Date  . APPENDECTOMY      Prior to Admission medications   Medication Sig Start Date End Date Taking? Authorizing Provider  famotidine (PEPCID) 40 MG tablet Take 1 tablet (40 mg total) by mouth daily. 06/09/18 06/09/19  Rudene Re, MD  omeprazole (PRILOSEC) 40 MG capsule Take 1 capsule (40 mg total) by mouth daily. 10/22/17 10/22/18  Nena Polio, MD    Allergies Patient has no known allergies.  History reviewed. No pertinent family history.  Social History Social History   Tobacco Use  . Smoking status: Former Smoker    Types: Cigarettes  . Smokeless tobacco: Never Used  Substance Use Topics  . Alcohol use: Yes  . Drug use: Yes    Types: Marijuana     Review of Systems  Constitutional: No fever/chills Gastrointestinal: No abdominal pain.  No nausea, no vomiting.  Genitourinary: Negative for dysuria. Musculoskeletal: Negative for musculoskeletal pain. Skin: Negative for rash, abrasions, lacerations, ecchymosis.   ____________________________________________   PHYSICAL EXAM:  VITAL SIGNS: ED Triage Vitals  Enc Vitals Group     BP 09/06/19 1644 127/64     Pulse Rate 09/06/19 1644 93     Resp 09/06/19 1644 16     Temp 09/06/19 1644 98.4 F (36.9 C)     Temp Source 09/06/19 1644 Oral     SpO2 09/06/19  1644 98 %     Weight 09/06/19 1645 170 lb (77.1 kg)     Height 09/06/19 1643 (P) 6\' 2"  (1.88 m)     Head Circumference --      Peak Flow --      Pain Score 09/06/19 1647 10     Pain Loc --      Pain Edu? --      Excl. in [place]? --      Constitutional: Alert and oriented. Well appearing and in no acute distress. Eyes: Conjunctivae are normal. PERRL. EOMI. Head: Atraumatic. ENT:      Ears:      Nose: No congestion/rhinnorhea.      Mouth/Throat: Mucous membranes are moist.  Neck: No stridor.   Cardiovascular: Good peripheral circulation. Respiratory: Normal respiratory effort without tachypnea or retractions.  Genitourinary: Deferred. Musculoskeletal: Full range of motion to all extremities. No gross deformities appreciated. Neurologic:  Normal speech and language. No gross focal neurologic deficits are appreciated.  Skin:  Skin is warm, dry and intact. No rash noted. Psychiatric: Mood and affect are normal. Speech and behavior are normal. Patient exhibits appropriate insight and judgement.   ____________________________________________   LABS (all labs ordered are listed, but only abnormal results are displayed)  Labs Reviewed  URINALYSIS, COMPLETE (UACMP) WITH MICROSCOPIC - Abnormal; Notable for the following components:      Result Value   Color, Urine YELLOW (*)    APPearance CLOUDY (*)  Protein, ur 30 (*)    Leukocytes,Ua LARGE (*)    WBC, UA >50 (*)    All other components within normal limits  GC/CHLAMYDIA PROBE AMP   ____________________________________________  EKG   ____________________________________________  RADIOLOGY   No results found.  ____________________________________________    PROCEDURES  Procedure(s) performed:    Procedures    Medications  cefTRIAXone (ROCEPHIN) injection 250 mg (250 mg Intramuscular Given 09/06/19 1811)  azithromycin (ZITHROMAX) tablet 1,000 mg (1,000 mg Oral Given 09/06/19 1808)  metroNIDAZOLE (FLAGYL)  tablet 2,000 mg (2,000 mg Oral Given 09/06/19 1807)  lidocaine (PF) (XYLOCAINE) 1 % injection (0.9 mLs  Given 09/06/19 1810)     ____________________________________________   INITIAL IMPRESSION / ASSESSMENT AND PLAN / ED COURSE  Pertinent labs & imaging results that were available during my care of the patient were reviewed by me and considered in my medical decision making (see chart for details).  Review of the [place] CSRS was performed in accordance of the [facility] prior to dispensing any controlled drugs.   Patient's diagnosis is consistent with STD exposure.  Patient was treated empirically with Rocephin, azithromycin and Flagyl.  Patient is to follow up with health department as directed. Patient is given ED precautions to return to the ED for any worsening or new symptoms.  Nicholas Robles. was evaluated in Emergency Department on 09/06/2019 for the symptoms described in the history of present illness. He was evaluated in the context of the global COVID-19 pandemic, which necessitated consideration that the patient might be at risk for infection with the SARS-CoV-2 virus that causes COVID-19. Institutional protocols and algorithms that pertain to the evaluation of patients at risk for COVID-19 are in a state of rapid change based on information released by regulatory bodies including the CDC and federal and state organizations. These policies and algorithms were followed during the patient's care in the ED.   ____________________________________________  FINAL CLINICAL IMPRESSION(S) / ED DIAGNOSES  Final diagnoses:  Penile discharge      NEW MEDICATIONS STARTED DURING THIS VISIT:  ED Discharge Orders    None          This chart was dictated using voice recognition software/Dragon. Despite best efforts to proofread, errors can occur which can change the meaning. Any change was purely unintentional.    Enid Derry, PA-C 09/06/19 2208    Phineas Semen,  MD 09/06/19 [phone]

## 2019-09-06 NOTE — ED Triage Notes (Signed)
Pt presents to ED via POV, states possible exposure to STD, c/o yellow penile discharge at this time.

## 2019-09-06 NOTE — ED Notes (Signed)
See triage note    States he thinks he may have a STD  States some burning with urination and some discharge

## 2019-09-08 LAB — GC/CHLAMYDIA PROBE AMP
Chlamydia trachomatis, NAA: NEGATIVE
Neisseria Gonorrhoeae by PCR: POSITIVE — AB

## 2019-09-09 ENCOUNTER — Telehealth: Payer: Self-pay | Admitting: Emergency Medicine

## 2019-09-09 NOTE — Telephone Encounter (Signed)
Called patient to give results for std testing.  Gave results and instructed on partner treatment.

## 2019-09-14 ENCOUNTER — Ambulatory Visit: Payer: Self-pay

## 2019-09-30 ENCOUNTER — Ambulatory Visit: Payer: Self-pay

## 2020-01-21 ENCOUNTER — Ambulatory Visit: Payer: Self-pay

## 2020-01-24 ENCOUNTER — Ambulatory Visit: Payer: Self-pay | Admitting: Family Medicine

## 2020-01-24 ENCOUNTER — Other Ambulatory Visit: Payer: Self-pay

## 2020-01-24 ENCOUNTER — Encounter: Payer: Self-pay | Admitting: Family Medicine

## 2020-01-24 DIAGNOSIS — Z113 Encounter for screening for infections with a predominantly sexual mode of transmission: Secondary | ICD-10-CM

## 2020-01-24 LAB — GRAM STAIN

## 2020-01-24 NOTE — Progress Notes (Signed)
Gram stain reviewed, no tx per standing order. Provider orders completed. 

## 2020-01-24 NOTE — Progress Notes (Signed)
  [facility] Department STI clinic/screening visit  Subjective:  Nicholas Robles. is a 34 y.o. male being seen today for No chief complaint on file.    The patient reports they do not have symptoms.   Patient has the following medical conditions:   Patient Active Problem List   Diagnosis Date Noted  . Gastroesophageal reflux disease without esophagitis 10/30/2016  . Chronic tension-type headache, not intractable 06/24/2016  . Postconcussion syndrome 06/24/2016    HPI  Pt reports he is here for STI screening, denies symptoms. Declines bloodwork.   Pt last urinated 2 hrs ago.  See flowsheet for further details and programmatic requirements.   Tdap: pt states w/in last 10 yrs.   No components found for: HCV  The following portions of the patient's history were reviewed and updated as appropriate: allergies, current medications, past medical history, past social history, past surgical history and problem list.  Objective:  There were no vitals filed for this visit.   Physical Exam Constitutional:      Appearance: Normal appearance.  HENT:     Head: Normocephalic and atraumatic.     Comments: No nits or hair loss    Mouth/Throat:     Mouth: Mucous membranes are moist.     Pharynx: Oropharynx is clear. No oropharyngeal exudate or posterior oropharyngeal erythema.  Pulmonary:     Effort: Pulmonary effort is normal.  Abdominal:     General: Abdomen is flat.     Palpations: Abdomen is soft. There is no hepatomegaly or mass.     Tenderness: There is no abdominal tenderness.  Genitourinary:    Pubic Area: No rash or pubic lice.      Penis: Normal and circumcised.      Testes: Normal.     Epididymis:     Right: Normal.     Left: Normal.     Rectum: Normal.  Lymphadenopathy:     Head:     Right side of head: No preauricular or posterior auricular adenopathy.     Left side of head: No preauricular or posterior auricular adenopathy.     Cervical: No  cervical adenopathy.     Upper Body:     Right upper body: No supraclavicular or axillary adenopathy.     Left upper body: No supraclavicular or axillary adenopathy.     Lower Body: No right inguinal adenopathy. No left inguinal adenopathy.  Skin:    General: Skin is warm and dry.     Findings: No rash.  Neurological:     Mental Status: He is alert and oriented to person, place, and time.       Assessment and Plan:  Nicholas Robles. is a 34 y.o. male presenting to the [facility] Department for STI screening    1. Screening examination for venereal disease -Pt without symptoms. Screenings today as below. Treat gram stain per standing order. -Patient does meet criteria for HepB, HepC Screening. Declines these and HIV and syphilis screenings. -Counseled on warning s/sx and when to seek care. Recommended condom use with all sex and discussed importance of condom use for STI prevention. - Gram stain - Gonococcus culture - urethra - Gonococcus culture - throat   Return for screening as needed.  No future appointments.  Ann Held, PA-C

## 2020-01-29 LAB — GONOCOCCUS CULTURE

## 2020-04-12 ENCOUNTER — Ambulatory Visit: Payer: Self-pay

## 2020-04-14 ENCOUNTER — Encounter: Payer: Self-pay | Admitting: Physician Assistant

## 2020-04-14 ENCOUNTER — Other Ambulatory Visit: Payer: Self-pay

## 2020-04-14 ENCOUNTER — Ambulatory Visit: Payer: Self-pay | Admitting: Physician Assistant

## 2020-04-14 DIAGNOSIS — Z113 Encounter for screening for infections with a predominantly sexual mode of transmission: Secondary | ICD-10-CM

## 2020-04-14 DIAGNOSIS — Z299 Encounter for prophylactic measures, unspecified: Secondary | ICD-10-CM

## 2020-04-14 LAB — GRAM STAIN

## 2020-04-14 MED ORDER — AZITHROMYCIN 500 MG PO TABS
1000.0000 mg | ORAL_TABLET | Freq: Once | ORAL | Status: AC
  Administered 2020-04-14: 1000 mg via ORAL

## 2020-04-14 NOTE — Progress Notes (Signed)
Gram Stain results reviewed by provider C. [place], [place]. Patient treated per provider orders. Tawny Hopping, RN

## 2020-04-14 NOTE — Progress Notes (Signed)
[facility] Department STI clinic/screening visit  Subjective:  Nicholas Robles. is a 34 y.o. male being seen today for an STI screening visit. The patient reports they do have symptoms.    Patient has the following medical conditions:   Patient Active Problem List   Diagnosis Date Noted   Gastroesophageal reflux disease without esophagitis 10/30/2016   Chronic tension-type headache, not intractable 06/24/2016   Postconcussion syndrome 06/24/2016     Chief Complaint  Patient presents with   SEXUALLY TRANSMITTED DISEASE    screening    HPI  Patient reports that he has had discharge from his penis one time, 2 days ago.  Denies other symptoms and requests a screening.  Reports last HIV test was 2 months ago and last void prior to sample collection for Gram stain was over 2 hr ago.   See flowsheet for further details and programmatic requirements.    The following portions of the patient's history were reviewed and updated as appropriate: allergies, current medications, past medical history, past social history, past surgical history and problem list.  Objective:  There were no vitals filed for this visit.  Physical Exam Constitutional:      General: He is not in acute distress.    Appearance: Normal appearance.  HENT:     Head: Normocephalic and atraumatic.     Comments: No nits, lice, or hair loss. No cervical, supraclavicular or axillary adenopathy.    Mouth/Throat:     Mouth: Mucous membranes are moist.     Pharynx: Oropharynx is clear. No oropharyngeal exudate or posterior oropharyngeal erythema.  Eyes:     Conjunctiva/sclera: Conjunctivae normal.  Pulmonary:     Effort: Pulmonary effort is normal.  Abdominal:     Palpations: Abdomen is soft. There is no mass.     Tenderness: There is no abdominal tenderness. There is no guarding or rebound.  Genitourinary:    Penis: Normal.      Testes: Normal.     Comments: Pubic area without nits,  lice,edema, erythema, lesions and inguinal adenopathy. Penis circumcised, without rash, lesions and discharge at meatus. Musculoskeletal:     Cervical back: Neck supple. No tenderness.  Skin:    General: Skin is warm and dry.     Findings: No bruising, erythema, lesion or rash.  Neurological:     Mental Status: He is alert and oriented to person, place, and time.  Psychiatric:        Mood and Affect: Mood normal.        Behavior: Behavior normal.        Thought Content: Thought content normal.        Judgment: Judgment normal.       Assessment and Plan:  Nicholas Robles. is a 34 y.o. male presenting to the [facility] Department for STI screening  1. Screening for STD (sexually transmitted disease) Patient into clinic with symptoms. Rec condoms with all sex. Await test results.  Counseled that RN will call if needs to RTC for treatment once results are back. - Gram stain - Gonococcus culture - Gonococcus culture  2. Prophylactic measure Will cover for NGU due to patient risks and symptoms with Azithromycin 1 g po DOT today. No sex for 7 days and until after partner/s complete treatment. RTC for re-treatment if vomits < 2 hr after taking medicine. - azithromycin (ZITHROMAX) tablet 1,000 mg     No follow-ups on file.  No future appointments.  Albin Felling  Herbert Moors, PA

## 2020-04-18 LAB — GONOCOCCUS CULTURE

## 2020-04-22 LAB — GONOCOCCUS CULTURE

## 2020-04-28 ENCOUNTER — Ambulatory Visit (INDEPENDENT_AMBULATORY_CARE_PROVIDER_SITE_OTHER): Payer: Self-pay

## 2020-04-28 ENCOUNTER — Ambulatory Visit
Admission: EM | Admit: 2020-04-28 | Discharge: 2020-04-28 | Disposition: A | Discharge status: Home / Self Care | Payer: Self-pay | Attending: Family Medicine | Admitting: Family Medicine

## 2020-04-28 ENCOUNTER — Ambulatory Visit
Admission: EM | Admit: 2020-04-28 | Discharge: 2020-04-28 | Disposition: A | Discharge status: Home / Self Care | Payer: Self-pay

## 2020-04-28 ENCOUNTER — Other Ambulatory Visit: Payer: Self-pay

## 2020-04-28 DIAGNOSIS — M79671 Pain in right foot: Secondary | ICD-10-CM

## 2020-04-28 DIAGNOSIS — M7750 Other enthesopathy of unspecified foot: Secondary | ICD-10-CM

## 2020-04-28 DIAGNOSIS — M7989 Other specified soft tissue disorders: Secondary | ICD-10-CM

## 2020-04-28 DIAGNOSIS — M25471 Effusion, right ankle: Secondary | ICD-10-CM

## 2020-04-28 DIAGNOSIS — M25571 Pain in right ankle and joints of right foot: Secondary | ICD-10-CM

## 2020-04-28 HISTORY — DX: Postconcussional syndrome: F07.81

## 2020-04-28 MED ORDER — MELOXICAM 15 MG PO TABS: 15.0000 mg | ORAL_TABLET | Freq: Every day | ORAL | 0 refills | Status: DC | PRN

## 2020-04-28 NOTE — ED Provider Notes (Signed)
[facility]    CSN: [id number] Arrival date & time: 04/28/20  1553      History   Chief Complaint Chief Complaint  Patient presents with  . Foot Pain  . Appointment   HPI   34 year old male presents with the above complaint.  Patient reports that he has had ongoing foot/ankle pain since Monday.  He does not recall an injury.  No change in his physical activity.  The pain is located around and below the lateral malleolus (right).  He reports associated swelling. Pain 10/10 in severity. No relieving factors. Worse with activity. No other complaints.  Past Medical History:  Diagnosis Date  . Head injury   . Post concussive syndrome    Patient Active Problem List   Diagnosis Date Noted  . Gastroesophageal reflux disease without esophagitis 10/30/2016  . Chronic tension-type headache, not intractable 06/24/2016  . Postconcussion syndrome 06/24/2016   Past Surgical History:  Procedure Laterality Date  . APPENDECTOMY      Home Medications    Prior to Admission medications   Medication Sig Start Date End Date Taking? Authorizing Provider  meloxicam (MOBIC) 15 MG tablet Take 1 tablet (15 mg total) by mouth daily as needed for pain. 04/28/20   Tommie Sams, DO   Social History Social History   Tobacco Use  . Smoking status: Former Smoker    Types: Cigarettes  . Smokeless tobacco: Never Used  Vaping Use  . Vaping Use: Never used  Substance Use Topics  . Alcohol use: Yes    Alcohol/week: 2.0 standard drinks    Types: 2 Shots of liquor per week  . Drug use: Yes    Frequency: 21.0 times per week    Types: Marijuana    Comment: last use today     Allergies   Patient has no known allergies.   Review of Systems Review of Systems  Constitutional: Negative.   Musculoskeletal:       Right foot/ankle pain.   Physical Exam Triage Vital Signs ED Triage Vitals  Enc Vitals Group     BP 04/28/20 1617 127/72     Pulse Rate 04/28/20 1617 72     Resp  04/28/20 1617 16     Temp 04/28/20 1617 98.4 F (36.9 C)     Temp Source 04/28/20 1617 Oral     SpO2 04/28/20 1617 100 %     Weight 04/28/20 1617 175 lb (79.4 kg)     Height 04/28/20 1617 6\' 1"  (1.854 m)     Head Circumference --      Peak Flow --      Pain Score 04/28/20 1615 10     Pain Loc --      Pain Edu? --      Excl. in GC? --    Updated Vital Signs BP 127/72 (BP Location: Left Arm)   Pulse 72   Temp 98.4 F (36.9 C) (Oral)   Resp 16   Ht 6\' 1"  (1.854 m)   Wt 79.4 kg   SpO2 100%   BMI 23.09 kg/m   Visual Acuity Right Eye Distance:   Left Eye Distance:   Bilateral Distance:    Right Eye Near:   Left Eye Near:    Bilateral Near:     Physical Exam Vitals and nursing note reviewed.  Constitutional:      General: He is not in acute distress.    Appearance: Normal appearance. He is not ill-appearing.  HENT:  Head: Normocephalic and atraumatic.  Eyes:     General:        Right eye: No discharge.        Left eye: No discharge.     Conjunctiva/sclera: Conjunctivae normal.  Cardiovascular:     Rate and Rhythm: Normal rate and regular rhythm.  Pulmonary:     Effort: Pulmonary effort is normal. No respiratory distress.  Musculoskeletal:     Comments: Right foot and ankle -mild swelling and tenderness just below the lateral malleolus.  Neurological:     Mental Status: He is alert.  Psychiatric:        Mood and Affect: Mood normal.        Behavior: Behavior normal.    UC Treatments / Results  Labs (all labs ordered are listed, but only abnormal results are displayed) Labs Reviewed - No data to display  EKG   Radiology DG Ankle Complete Right  Result Date: 04/28/2020 CLINICAL DATA:  Pain and swelling EXAM: RIGHT ANKLE - COMPLETE 3+ VIEW COMPARISON:  None. FINDINGS: There is no evidence of fracture, dislocation, or joint effusion. There is no evidence of arthropathy or other focal bone abnormality. Soft tissues are unremarkable. IMPRESSION: Negative.  Electronically Signed   By: Jasmine Pang M.D.   On: 04/28/2020 16:55   DG Foot Complete Right  Result Date: 04/28/2020 CLINICAL DATA:  Pain and swelling EXAM: RIGHT FOOT COMPLETE - 3+ VIEW COMPARISON:  None. FINDINGS: There is no evidence of fracture or dislocation. There is no evidence of arthropathy or other focal bone abnormality. Soft tissues are unremarkable. IMPRESSION: Negative. Electronically Signed   By: Jasmine Pang M.D.   On: 04/28/2020 16:55    Procedures Procedures (including critical care time)  Medications Ordered in UC Medications - No data to display  Initial Impression / Assessment and Plan / UC Course  I have reviewed the triage vital signs and the nursing notes.  Pertinent labs & imaging results that were available during my care of the patient were reviewed by me and considered in my medical decision making (see chart for details).    34 year old male presents with suspected tendinitis. X-ray obtained and intimately reviewed by me. No acute fracture. Normal x-ray. Meloxicam as directed. Rest, ice, elevation. If persists, recommend seeing podiatry.  Final Clinical Impressions(s) / UC Diagnoses   Final diagnoses:  Tendonitis of ankle or foot     Discharge Instructions     Rest, ice, elevation.  Medication as prescribed.  If persists, see Podiatry.  Take care  Dr. Adriana Simas    ED Prescriptions    Medication Sig Dispense Auth. Provider   meloxicam (MOBIC) 15 MG tablet Take 1 tablet (15 mg total) by mouth daily as needed for pain. 30 tablet Tommie Sams, DO     PDMP not reviewed this encounter.   Tommie Sams, [place] 04/28/20 2136

## 2020-04-28 NOTE — ED Triage Notes (Addendum)
Pt reports when he stepped out of bed Monday morning he had pain in his foot/ankle.  He is unaware of injury but has hurt ever since. Pain is "burning" in nature.

## 2020-04-28 NOTE — Discharge Instructions (Signed)
Rest, ice, elevation.  Medication as prescribed.  If persists, see Podiatry.  Take care  Dr. Adriana Simas

## 2020-08-31 ENCOUNTER — Other Ambulatory Visit: Payer: Self-pay

## 2020-08-31 ENCOUNTER — Encounter: Payer: Self-pay | Admitting: Family Medicine

## 2020-08-31 ENCOUNTER — Ambulatory Visit: Payer: Self-pay | Admitting: Family Medicine

## 2020-08-31 DIAGNOSIS — Z113 Encounter for screening for infections with a predominantly sexual mode of transmission: Secondary | ICD-10-CM

## 2020-08-31 LAB — GRAM STAIN

## 2020-08-31 NOTE — Progress Notes (Signed)
Gram stain reviewed and is negative today, so no treatment needed for gram stain per standing order. Awaiting TR's. Counseled pt per provider orders and pt states understanding. Provider orders completed. 

## 2020-08-31 NOTE — Progress Notes (Signed)
   [facility] Department STI clinic/screening visit  Subjective:  Nicholas Robles. is a 35 y.o. male being seen today for an STI screening visit. The patient reports they do not have symptoms.    Patient has the following medical conditions:   Patient Active Problem List   Diagnosis Date Noted  . Gastroesophageal reflux disease without esophagitis 10/30/2016  . Chronic tension-type headache, not intractable 06/24/2016  . Postconcussion syndrome 06/24/2016     Chief Complaint  Patient presents with  . SEXUALLY TRANSMITTED DISEASE    HPI  Patient reports no symptoms and wants screening "to make sure Im ok after unprotected sex"    See flowsheet for further details and programmatic requirements.    The following portions of the patient's history were reviewed and updated as appropriate: allergies, current medications, past medical history, past social history, past surgical history and problem list.  Objective:  There were no vitals filed for this visit.  Physical Exam Constitutional:      Appearance: Normal appearance.  HENT:     Head: Normocephalic.     Mouth/Throat:     Mouth: Mucous membranes are moist.     Pharynx: Oropharynx is clear. No oropharyngeal exudate.  Genitourinary:    Comments: No lice, nits, or pest, no lesions or odor discharge.  Denies pain or tenderness with paplation of testicles.  No lesions, ulcers or masses present.   Musculoskeletal:     Cervical back: Normal range of motion and neck supple.  Lymphadenopathy:     Cervical: No cervical adenopathy.  Skin:    General: Skin is warm and dry.     Findings: No bruising, erythema, lesion or rash.  Neurological:     Mental Status: He is alert.  Psychiatric:        Mood and Affect: Mood normal.        Behavior: Behavior normal.       Assessment and Plan:  Nicholas Robles. is a 35 y.o. male presenting to the [facility] Department for STI screening  1.  Screening examination for venereal disease  Screening test done today.  - Gonococcus culture - Gram stain - Gonococcus culture  Patient does not have STI symptoms Patient accepted all screenings including   Oral and urethral GC, gram stain and patient declined bloodwork for HIV/RPR.  Patient meets criteria for HepB screening? Yes. Ordered? No - patient declined  Patient meets criteria for HepC screening? Yes. Ordered? No - patient declined  Recommended condom use with all sex Discussed importance of condom use for STI prevent  Treat gram stain per standing order  Discussed time line for State Lab results and that patient will be called with positive results and encouraged patient to call if he had not heard in 2 weeks  Recommended returning for continued or worsening symptoms.     Return for as needed.  No future appointments.  Wendi Snipes, FNP

## 2020-09-05 LAB — GONOCOCCUS CULTURE

## 2020-09-22 ENCOUNTER — Other Ambulatory Visit: Payer: Self-pay

## 2020-09-22 ENCOUNTER — Emergency Department
Admission: EM | Admit: 2020-09-22 | Discharge: 2020-09-22 | Disposition: A | Discharge status: Home / Self Care | Payer: Self-pay | Attending: Emergency Medicine | Admitting: Emergency Medicine

## 2020-09-22 DIAGNOSIS — Z87891 Personal history of nicotine dependence: Secondary | ICD-10-CM | POA: Insufficient documentation

## 2020-09-22 DIAGNOSIS — S61411A Laceration without foreign body of right hand, initial encounter: Secondary | ICD-10-CM | POA: Insufficient documentation

## 2020-09-22 DIAGNOSIS — Z23 Encounter for immunization: Secondary | ICD-10-CM | POA: Insufficient documentation

## 2020-09-22 DIAGNOSIS — W231XXA Caught, crushed, jammed, or pinched between stationary objects, initial encounter: Secondary | ICD-10-CM | POA: Insufficient documentation

## 2020-09-22 DIAGNOSIS — Y9289 Other specified places as the place of occurrence of the external cause: Secondary | ICD-10-CM | POA: Insufficient documentation

## 2020-09-22 MED ORDER — CEPHALEXIN 500 MG PO CAPS: 500.0000 mg | ORAL_CAPSULE | Freq: Three times a day (TID) | ORAL | 0 refills | Status: AC

## 2020-09-22 MED ORDER — LIDOCAINE HCL 1 % IJ SOLN
5.0000 mL | Freq: Once | INTRAMUSCULAR | Status: AC
  Administered 2020-09-22: 5 mL
  Filled 2020-09-22: qty 10

## 2020-09-22 MED ORDER — TETANUS-DIPHTH-ACELL PERTUSSIS 5-2.5-18.5 LF-MCG/0.5 IM SUSY
0.5000 mL | PREFILLED_SYRINGE | Freq: Once | INTRAMUSCULAR | Status: AC
  Administered 2020-09-22: 0.5 mL via INTRAMUSCULAR
  Filled 2020-09-22: qty 0.5

## 2020-09-22 NOTE — ED Notes (Signed)
See triage note. Pt ambulatory to room. Pt with laceration on his right hand. Pt covering with abdominal pad. Laceration still has minimal bleeding. Pt in NAD.

## 2020-09-22 NOTE — ED Triage Notes (Signed)
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 Pt holding an abd pad on the laceration

## 2020-09-22 NOTE — ED Provider Notes (Signed)
[facility]  ____________________________________________  Time seen: Approximately 5:43 PM  I have reviewed the triage vital signs and the nursing notes.   HISTORY  Chief Complaint Laceration   Historian Patient     HPI Nicholas Robles. is a 35 y.o. male presents to the emergency department with a 2 and half centimeter laceration along the medial aspect of the right hand.  Patient states that he was demonstrating a basketball maneuver in the break room and accidentally caught his hand against the air conditioning vent.  Patient denies numbness or tingling in the right hand and is unsure of his last tetanus shot.    Past Medical History:  Diagnosis Date  . Head injury   . Post concussive syndrome      Immunizations up to date:  Yes.     Past Medical History:  Diagnosis Date  . Head injury   . Post concussive syndrome     Patient Active Problem List   Diagnosis Date Noted  . Gastroesophageal reflux disease without esophagitis 10/30/2016  . Chronic tension-type headache, not intractable 06/24/2016  . Postconcussion syndrome 06/24/2016    Past Surgical History:  Procedure Laterality Date  . APPENDECTOMY      Prior to Admission medications   Medication Sig Start Date End Date Taking? Authorizing Provider  cephALEXin (KEFLEX) 500 MG capsule Take 1 capsule (500 mg total) by mouth 3 (three) times daily for 7 days. 09/22/20 09/29/20 Yes Pia Mau M, PA-C  meloxicam (MOBIC) 15 MG tablet Take 1 tablet (15 mg total) by mouth daily as needed for pain. Patient not taking: Reported on 08/31/2020 04/28/20   Tommie Sams, DO    Allergies Patient has no known allergies.  No family history on file.  Social History Social History   Tobacco Use  . Smoking status: Former Smoker    Types: Cigarettes  . Smokeless tobacco: Never Used  Vaping Use  . Vaping Use: Never used  Substance Use Topics  . Alcohol use: Yes    Alcohol/week: 2.0 standard  drinks    Types: 2 Shots of liquor per week    Comment: occasionally   . Drug use: Yes    Frequency: 21.0 times per week    Types: Marijuana    Comment: last use today     Review of Systems  Constitutional: No fever/chills Eyes:  No discharge ENT: No upper respiratory complaints. Respiratory: no cough. No SOB/ use of accessory muscles to breath Gastrointestinal:   No nausea, no vomiting.  No diarrhea.  No constipation. Musculoskeletal: Patient has right hand pain.  Skin: Patient has right hand laceration.     ____________________________________________   PHYSICAL EXAM:  VITAL SIGNS: ED Triage Vitals  Enc Vitals Group     BP 09/22/20 1553 121/66     Pulse Rate 09/22/20 1553 63     Resp 09/22/20 1553 16     Temp 09/22/20 1553 98.4 F (36.9 C)     Temp Source 09/22/20 1553 Oral     SpO2 09/22/20 1553 100 %     Weight 09/22/20 1554 175 lb (79.4 kg)     Height 09/22/20 1554 6\' 2"  (1.88 m)     Head Circumference --      Peak Flow --      Pain Score 09/22/20 1554 8     Pain Loc --      Pain Edu? --      Excl. in GC? --  Constitutional: Alert and oriented. Well appearing and in no acute distress. Eyes: Conjunctivae are normal. PERRL. EOMI. Head: Atraumatic. ENT:      Nose: No congestion/rhinnorhea.      Mouth/Throat: Mucous membranes are moist.  Neck: No stridor.  No cervical spine tenderness to palpation. Hematological/Lymphatic/Immunilogical: No cervical lymphadenopathy.  Cardiovascular: Normal rate, regular rhythm. Normal S1 and S2.  Good peripheral circulation. Respiratory: Normal respiratory effort without tachypnea or retractions. Lungs CTAB. Good air entry to the bases with no decreased or absent breath sounds Gastrointestinal: Bowel sounds x 4 quadrants. Soft and nontender to palpation. No guarding or rigidity. No distention. Musculoskeletal: Full range of motion to all extremities. No obvious deformities noted Neurologic:  Normal for age. No gross  focal neurologic deficits are appreciated.  Skin: Patient has a 2 and half centimeter laceration along the medial aspect of the right hand deep to underlying adipose tissue. Psychiatric: Mood and affect are normal for age. Speech and behavior are normal.   ____________________________________________   LABS (all labs ordered are listed, but only abnormal results are displayed)  Labs Reviewed - No data to display ____________________________________________  EKG   ____________________________________________  RADIOLOGY   No results found.  ____________________________________________    PROCEDURES  Procedure(s) performed:     Marland KitchenMarland KitchenLaceration Repair  Date/Time: 09/22/2020 5:46 PM Performed by: Orvil Feil, PA-C Authorized by: Orvil Feil, PA-C   Consent:    Consent obtained:  Verbal   Consent given by:  Patient   Risks discussed:  Infection, pain, retained foreign body, poor cosmetic result and poor wound healing Universal protocol:    Procedure explained and questions answered to patient or proxy's satisfaction: yes     Patient identity confirmed:  Verbally with patient Anesthesia:    Anesthesia method:  Local infiltration   Local anesthetic:  Lidocaine 1% w/o epi Laceration details:    Location:  Hand   Hand location:  R palm   Length (cm):  2.5   Depth (mm):  5 Exploration:    Hemostasis achieved with:  Direct pressure   Contaminated: no   Treatment:    Area cleansed with:  Saline   Amount of cleaning:  Extensive   Irrigation solution:  Sterile saline   Visualized foreign bodies/material removed: no   Skin repair:    Repair method:  Sutures   Suture size:  4-0   Suture technique:  Running locked   Number of sutures:  7 Approximation:    Approximation:  Close Repair type:    Repair type:  Simple Post-procedure details:    Dressing:  Sterile dressing   Procedure completion:  Tolerated well, no immediate complications       Medications   Tdap (BOOSTRIX) injection 0.5 mL (has no administration in time range)  lidocaine (XYLOCAINE) 1 % (with pres) injection 5 mL (5 mLs Infiltration Given by Other 09/22/20 1702)     ____________________________________________   INITIAL IMPRESSION / ASSESSMENT AND PLAN / ED COURSE  Pertinent labs & imaging results that were available during my care of the patient were reviewed by me and considered in my medical decision making (see chart for details).      Assessment and plan Laceration 35 year old male presents to the emergency department with a 2-1/2 cm laceration along the medial aspect of the right hand.  Laceration was repaired in the emergency department without complication.  He was advised to have external sutures removed by primary care in 7 days.  Tetanus status was updated prior  to discharge.  All patient questions were answered.     ____________________________________________  FINAL CLINICAL IMPRESSION(S) / ED DIAGNOSES  Final diagnoses:  Laceration of right hand without foreign body, initial encounter      NEW MEDICATIONS STARTED DURING THIS VISIT:  ED Discharge Orders         Ordered    cephALEXin (KEFLEX) 500 MG capsule  3 times daily        09/22/20 1723              This chart was dictated using voice recognition software/Dragon. Despite best efforts to proofread, errors can occur which can change the meaning. Any change was purely unintentional.     Orvil Feil, PA-C 09/22/20 1747    Phineas Semen, MD 09/22/20 [phone]

## 2020-09-22 NOTE — Discharge Instructions (Signed)
Take Keflex three times daily for the next seven days.  Have sutures removed in seven days.  Avoid pools/hot tubs for the next five days.  Take Tylenol and Ibuprofen for pain.

## 2020-10-04 ENCOUNTER — Emergency Department
Admission: EM | Admit: 2020-10-04 | Discharge: 2020-10-04 | Disposition: A | Discharge status: Home / Self Care | Payer: Self-pay | Attending: Emergency Medicine | Admitting: Emergency Medicine

## 2020-10-04 ENCOUNTER — Other Ambulatory Visit: Payer: Self-pay

## 2020-10-04 DIAGNOSIS — Z87891 Personal history of nicotine dependence: Secondary | ICD-10-CM | POA: Insufficient documentation

## 2020-10-04 DIAGNOSIS — Z4802 Encounter for removal of sutures: Secondary | ICD-10-CM

## 2020-10-04 DIAGNOSIS — S61411D Laceration without foreign body of right hand, subsequent encounter: Secondary | ICD-10-CM | POA: Insufficient documentation

## 2020-10-04 DIAGNOSIS — X58XXXD Exposure to other specified factors, subsequent encounter: Secondary | ICD-10-CM | POA: Insufficient documentation

## 2020-10-04 NOTE — ED Triage Notes (Signed)
Suture removal to right hand, placed 13 days ago. Pt alert and oriented X4, cooperative, RR even and unlabored, color WNL. Pt in NAD.

## 2020-10-04 NOTE — Discharge Instructions (Addendum)
Continue to keep the area clean and dry.  Watch for any signs of infection.  Clean daily with mild soap and water.

## 2020-10-04 NOTE — ED Provider Notes (Signed)
[facility] Emergency Department Provider Note  ____________________________________________   Event Date/Time   First MD Initiated Contact with Patient 10/04/20 1149     (approximate)  I have reviewed the triage vital signs and the nursing notes.   HISTORY  Chief Complaint Suture / Staple Removal   HPI Sallie Maker. is a 35 y.o. male presents to the ED for suture removal.  Patient states he had sutures placed in his right hand 13 days ago.  Patient denies any difficulty or known drainage from the area.     Past Medical History:  Diagnosis Date  . Head injury   . Post concussive syndrome     Patient Active Problem List   Diagnosis Date Noted  . Gastroesophageal reflux disease without esophagitis 10/30/2016  . Chronic tension-type headache, not intractable 06/24/2016  . Postconcussion syndrome 06/24/2016    Past Surgical History:  Procedure Laterality Date  . APPENDECTOMY      Prior to Admission medications   Not on File    Allergies Patient has no known allergies.  No family history on file.  Social History Social History   Tobacco Use  . Smoking status: Former Smoker    Types: Cigarettes  . Smokeless tobacco: Never Used  Vaping Use  . Vaping Use: Never used  Substance Use Topics  . Alcohol use: Yes    Alcohol/week: 2.0 standard drinks    Types: 2 Shots of liquor per week    Comment: occasionally   . Drug use: Yes    Frequency: 21.0 times per week    Types: Marijuana    Comment: last use today    Review of Systems Constitutional: No fever/chills Cardiovascular: Denies chest pain. Respiratory: Denies shortness of breath. Gastrointestinal:   No nausea, no vomiting.  Musculoskeletal: Negative for muscle skeletal pain. Skin: Sutured right hand. Neurological: Negative for headaches, focal weakness or numbness. ____________________________________________   PHYSICAL EXAM:  VITAL SIGNS: ED Triage Vitals   Enc Vitals Group     BP 10/04/20 1146 129/79     Pulse Rate 10/04/20 1146 67     Resp 10/04/20 1146 18     Temp 10/04/20 1146 98.3 F (36.8 C)     Temp Source 10/04/20 1146 Oral     SpO2 10/04/20 1146 100 %     Weight 10/04/20 1145 174 lb 2.6 oz (79 kg)     Height 10/04/20 1145 6\' 2"  (1.88 m)     Head Circumference --      Peak Flow --      Pain Score 10/04/20 1145 0     Pain Loc --      Pain Edu? --      Excl. in GC? --     Constitutional: Alert and oriented. Well appearing and in no acute distress. Eyes: Conjunctivae are normal.  Head: Atraumatic. Neck: No stridor.   Cardiovascular: Normal rate, regular rhythm. Grossly normal heart sounds.  Good peripheral circulation. Respiratory: Normal respiratory effort.  No retractions. Lungs CTAB. Musculoskeletal: Moves right hand without any difficulty or restriction. Neurologic:  Normal speech and language. No gross focal neurologic deficits are appreciated. No gait instability. Skin:  Skin is warm, dry and intact.  Well-healed scar. Psychiatric: Mood and affect are normal. Speech and behavior are normal.  ____________________________________________   LABS (all labs ordered are listed, but only abnormal results are displayed)  Labs Reviewed - No data to display ____________________________________________  PROCEDURES  Procedure(s) performed (including Critical Care):  Procedures   ____________________________________________   INITIAL IMPRESSION / ASSESSMENT AND PLAN / ED COURSE  As part of my medical decision making, I reviewed the following data within the electronic MEDICAL RECORD NUMBER Notes from prior ED visits and [place] Controlled Substance Database  35 year old male presents to the ED for suture removal.  Sutures were removed and patient had no difficulties and tolerated well.  He is encouraged to continue cleaning with mild soap and water and watch for any signs of infection.  He is to follow-up with his PCP or  [facility] acute care if any problems.  ____________________________________________   FINAL CLINICAL IMPRESSION(S) / ED DIAGNOSES  Final diagnoses:  Encounter for removal of sutures     ED Discharge Orders    None      *Please note:  Xzavien Harada. was evaluated in Emergency Department on 10/04/2020 for the symptoms described in the history of present illness. He was evaluated in the context of the global COVID-19 pandemic, which necessitated consideration that the patient might be at risk for infection with the SARS-CoV-2 virus that causes COVID-19. Institutional protocols and algorithms that pertain to the evaluation of patients at risk for COVID-19 are in a state of rapid change based on information released by regulatory bodies including the CDC and federal and state organizations. These policies and algorithms were followed during the patient's care in the ED.  Some ED evaluations and interventions may be delayed as a result of limited staffing during and the pandemic.*   Note:  This document was prepared using Dragon voice recognition software and may include unintentional dictation errors.    Tommi Rumps, PA-C 10/04/20 1408    Dionne Bucy, MD 10/04/20 [phone]

## 2021-05-03 ENCOUNTER — Ambulatory Visit: Payer: Self-pay

## 2021-05-14 ENCOUNTER — Encounter: Payer: Self-pay | Admitting: Physician Assistant

## 2021-05-14 ENCOUNTER — Ambulatory Visit: Payer: Self-pay | Admitting: Physician Assistant

## 2021-05-14 ENCOUNTER — Other Ambulatory Visit: Payer: Self-pay

## 2021-05-14 DIAGNOSIS — Z113 Encounter for screening for infections with a predominantly sexual mode of transmission: Secondary | ICD-10-CM

## 2021-05-14 LAB — GRAM STAIN

## 2021-05-14 LAB — HM HIV SCREENING LAB: HM HIV Screening: NEGATIVE

## 2021-05-14 NOTE — Progress Notes (Signed)
Pt here for STD screening.  Gram stain results reviewed, no treatment required per SO.  Condoms given.  Gracey Tolle M Gowri Suchan, RN  

## 2021-05-14 NOTE — Progress Notes (Signed)
[facility] Department STI clinic/screening visit  Subjective:  Nicholas Robles. is a 35 y.o. male being seen today for an STI screening visit. The patient reports they do not have symptoms.    Patient has the following medical conditions:   Patient Active Problem List   Diagnosis Date Noted   Gastroesophageal reflux disease without esophagitis 10/30/2016   Chronic tension-type headache, not intractable 06/24/2016   Postconcussion syndrome 06/24/2016     Chief Complaint  Patient presents with   SEXUALLY TRANSMITTED DISEASE    Screening    HPI  Patient reports that he is not having any symptoms but would like a screening today. Denies chronic conditions and regular medicines.  Reports last HIV test was last year and last void prior to sample collection for Gram stain was less than 2 hr  ago.    Screening for MPX risk: Does the patient have an unexplained rash? No Is the patient MSM? No Does the patient endorse multiple sex partners or anonymous sex partners? No Did the patient have close or sexual contact with a person diagnosed with MPX? No Has the patient traveled outside the [place] where MPX is endemic? No Is there a high clinical suspicion for MPX-- evidenced by one of the following No  -Unlikely to be chickenpox  -Lymphadenopathy  -Rash that present in same phase of evolution on any given body part   See flowsheet for further details and programmatic requirements.    The following portions of the patient's history were reviewed and updated as appropriate: allergies, current medications, past medical history, past social history, past surgical history and problem list.  Objective:  There were no vitals filed for this visit.  Physical Exam Constitutional:      General: He is not in acute distress.    Appearance: Normal appearance.  HENT:     Head: Normocephalic and atraumatic.     Comments: No nits,lice, or hair loss. No cervical,  supraclavicular or axillary adenopathy.     Mouth/Throat:     Mouth: Mucous membranes are moist.     Pharynx: Oropharynx is clear. No oropharyngeal exudate or posterior oropharyngeal erythema.  Eyes:     Conjunctiva/sclera: Conjunctivae normal.  Pulmonary:     Effort: Pulmonary effort is normal.  Abdominal:     Palpations: Abdomen is soft. There is no mass.     Tenderness: There is no abdominal tenderness. There is no guarding or rebound.  Genitourinary:    Penis: Normal.      Testes: Normal.     Comments: Pubic area without nits, lice, hair loss, edema, erythema, lesions and inguinal adenopathy. Penis circumcised without rash, lesions and discharge at meatus. Testicles descended bilaterally,nt, no masses or edema.  Musculoskeletal:     Cervical back: Neck supple. No tenderness.  Skin:    General: Skin is warm and dry.     Findings: No bruising, erythema, lesion or rash.  Neurological:     Mental Status: He is alert and oriented to person, place, and time.  Psychiatric:        Mood and Affect: Mood normal.        Behavior: Behavior normal.        Thought Content: Thought content normal.        Judgment: Judgment normal.      Assessment and Plan:  Nicholas Robles. is a 35 y.o. male presenting to the [facility] Department for STI screening  1. Screening for  STD (sexually transmitted disease) Patient into clinic without symptoms. Rec condoms with all sex. Await test results.  Counseled that RN will call if needs to RTC for treatment once results are back.  - Gram stain - Gonococcus culture - HIV [facility] - Syphilis Serology, [facility]     No follow-ups on file.  No future appointments.  Matt Holmes, PA

## 2021-05-19 LAB — GONOCOCCUS CULTURE

## 2021-07-18 ENCOUNTER — Other Ambulatory Visit: Payer: Self-pay

## 2021-07-18 ENCOUNTER — Emergency Department
Admission: EM | Admit: 2021-07-18 | Discharge: 2021-07-18 | Disposition: A | Discharge status: Home / Self Care | Payer: Self-pay | Attending: Emergency Medicine | Admitting: Emergency Medicine

## 2021-07-18 DIAGNOSIS — Z87891 Personal history of nicotine dependence: Secondary | ICD-10-CM | POA: Insufficient documentation

## 2021-07-18 DIAGNOSIS — J209 Acute bronchitis, unspecified: Secondary | ICD-10-CM | POA: Insufficient documentation

## 2021-07-18 DIAGNOSIS — Z20822 Contact with and (suspected) exposure to covid-19: Secondary | ICD-10-CM | POA: Insufficient documentation

## 2021-07-18 DIAGNOSIS — J40 Bronchitis, not specified as acute or chronic: Secondary | ICD-10-CM

## 2021-07-18 LAB — RESP PANEL BY RT-PCR (FLU A&B, COVID) ARPGX2
Influenza A by PCR: NEGATIVE
Influenza B by PCR: NEGATIVE
SARS Coronavirus 2 by RT PCR: NEGATIVE

## 2021-07-18 MED ORDER — AZITHROMYCIN 250 MG PO TABS: ORAL_TABLET | ORAL | 0 refills | Status: AC

## 2021-07-18 MED ORDER — AZITHROMYCIN 500 MG PO TABS
500.0000 mg | ORAL_TABLET | Freq: Once | ORAL | Status: AC
  Administered 2021-07-18: 500 mg via ORAL
  Filled 2021-07-18: qty 1

## 2021-07-18 NOTE — ED Provider Notes (Addendum)
[facility] Emergency Department Provider Note   ____________________________________________   Event Date/Time   First MD Initiated Contact with Patient 07/18/21 1319     (approximate)  I have reviewed the triage vital signs and the nursing notes.   HISTORY  Chief Complaint Nasal Congestion   HPI Nicholas Robles. is a 35 y.o. male patient complains of cough productive of thick yellowish mucus with some pleuritic chest pain in the right upper chest.  He is not short of breath.  He also reports greenish to yellowish nasal discharge which is very thick.  He has had sensation of fever and chills.  He is unable to sleep well because of congestion.        Past Medical History:  Diagnosis Date   Head injury    Post concussive syndrome     Patient Active Problem List   Diagnosis Date Noted   Gastroesophageal reflux disease without esophagitis 10/30/2016   Chronic tension-type headache, not intractable 06/24/2016   Postconcussion syndrome 06/24/2016    Past Surgical History:  Procedure Laterality Date   APPENDECTOMY      Prior to Admission medications   Medication Sig Start Date End Date Taking? Authorizing Provider  azithromycin (ZITHROMAX Z-PAK) 250 MG tablet Take 2 tablets (500 mg) on  Day 1,  followed by 1 tablet (250 mg) once daily on Days 2 through 5. 07/18/21 07/23/21 Yes Arnaldo Natal, MD    Allergies Patient has no known allergies.  No family history on file.  Social History Social History   Tobacco Use   Smoking status: Former    Types: Cigarettes   Smokeless tobacco: Never  Vaping Use   Vaping Use: Never used  Substance Use Topics   Alcohol use: Yes    Alcohol/week: 2.0 standard drinks    Types: 2 Shots of liquor per week    Comment: occasionally    Drug use: Yes    Frequency: 21.0 times per week    Types: Marijuana    Review of Systems  Constitutional: Subjective fever/chills Eyes: No visual changes. ENT:   sore throat. Cardiovascular: Some right-sided pleuritic chest pain. Respiratory: Denies shortness of breath. Gastrointestinal: No abdominal pain.  No nausea, no vomiting.  No diarrhea.  No constipation. Genitourinary: Negative for dysuria. Musculoskeletal: Negative for back pain. Skin: Negative for rash. Neurological: Negative for headaches, focal weakness  ____________________________________________   PHYSICAL EXAM:  VITAL SIGNS: ED Triage Vitals  Enc Vitals Group     BP 07/18/21 1151 131/82     Pulse Rate 07/18/21 1151 74     Resp 07/18/21 1151 18     Temp 07/18/21 1151 98.4 F (36.9 C)     Temp Source 07/18/21 1151 Oral     SpO2 07/18/21 1151 97 %     Weight 07/18/21 1141 180 lb (81.6 kg)     Height 07/18/21 1141 6\' 2"  (1.88 m)     Head Circumference --      Peak Flow --      Pain Score 07/18/21 1140 4     Pain Loc --      Pain Edu? --      Excl. in GC? --     Constitutional: Alert and oriented. Well appearing and in no acute distress. Eyes: Conjunctivae are normal.  Head: Atraumatic. Nose: No congestion/rhinnorhea. Mouth/Throat: Mucous membranes are moist.  Oropharynx non-erythematous. Neck: No stridor.  Cardiovascular: Normal rate, regular rhythm. Grossly normal heart sounds.  Good peripheral  circulation.  Chest is tender over the costochondral junctions on the right side.  Palpation there exactly reproduces his pain.  There is no pain on palpation elsewhere. Respiratory: Normal respiratory effort.  No retractions. Lungs CTAB. Musculoskeletal: No lower extremity tenderness nor edema.   Neurologic:  Normal speech and language. No gross focal neurologic deficits are appreciated. Skin:  Skin is warm, dry and intact. No rash noted.   ____________________________________________   LABS (all labs ordered are listed, but only abnormal results are displayed)  Labs Reviewed  RESP PANEL BY RT-PCR (FLU A&B, COVID) [id number]    ____________________________________________  EKG   ____________________________________________  RADIOLOGY Gertha Calkin, personally viewed and evaluated these images (plain radiographs) as part of my medical decision making, as well as reviewing the written report by the radiologist.  ED MD interpretation:    Official radiology report(s): No results found.  ____________________________________________   PROCEDURES  Procedure(s) performed (including Critical Care):  Procedures   ____________________________________________   INITIAL IMPRESSION / ASSESSMENT AND PLAN / ED COURSE   Patient with symptoms consistent with bronchitis or possibly early pneumonia.  His lungs are clear.  He is not tachycardic.  He is not short of breath.  He is young and healthy.  He has no leg swelling or edema or tenderness.  I will treat him for bronchitis and sinusitis with some Zithromax.  There has been this "crud" going around that is responsive to antibiotics.  I think this is what he has.          ____________________________________________   FINAL CLINICAL IMPRESSION(S) / ED DIAGNOSES  Final diagnoses:  Bronchitis     ED Discharge Orders          Ordered    azithromycin (ZITHROMAX Z-PAK) 250 MG tablet        07/18/21 1324             Note:  This document was prepared using Dragon voice recognition software and may include unintentional dictation errors.    Nena Polio, MD 07/18/21 1330    Nena Polio, MD 07/18/21 [phone]

## 2021-07-18 NOTE — Discharge Instructions (Signed)
I think you have bronchitis and may be sinus infection as well.  I am going to give you some Zithromax.  We will give you a dose here in the emergency department and then a prescription to start tomorrow.  Please return if you are feeling sicker or get short of breath or have any other problems.  Also return if you are not any better in 3 to 4 days or you can follow-up with your doctor.

## 2021-07-18 NOTE — ED Triage Notes (Signed)
Pt reports flu/covid sx since Sunday. Runny nose and congestion NAD noted

## 2022-01-03 ENCOUNTER — Encounter: Payer: Self-pay | Admitting: Emergency Medicine

## 2022-01-03 ENCOUNTER — Other Ambulatory Visit: Payer: Self-pay

## 2022-01-03 ENCOUNTER — Emergency Department
Admission: EM | Admit: 2022-01-03 | Discharge: 2022-01-03 | Disposition: A | Discharge status: Home / Self Care | Payer: Self-pay | Attending: Emergency Medicine | Admitting: Emergency Medicine

## 2022-01-03 ENCOUNTER — Emergency Department: Payer: Self-pay

## 2022-01-03 DIAGNOSIS — S299XXA Unspecified injury of thorax, initial encounter: Secondary | ICD-10-CM | POA: Insufficient documentation

## 2022-01-03 DIAGNOSIS — W11XXXA Fall on and from ladder, initial encounter: Secondary | ICD-10-CM | POA: Insufficient documentation

## 2022-01-03 MED ORDER — NAPROXEN 500 MG PO TABS: 500.0000 mg | ORAL_TABLET | Freq: Two times a day (BID) | ORAL | 11 refills | Status: AC

## 2022-01-03 MED ORDER — HYDROCODONE-ACETAMINOPHEN 5-325 MG PO TABS: 1.0000 | ORAL_TABLET | Freq: Four times a day (QID) | ORAL | 0 refills | Status: AC | PRN

## 2022-01-03 NOTE — ED Triage Notes (Signed)
Pt via POV from home. Pt c/o L sided rib pain after a fall off a ladder, states approx 5 ft. Denies any LOC. Denies any head injury. States that the foot of the ladder hit him on the L side. States it hurts to take in a deep breath. Pt is A&Ox4 and NAD

## 2022-01-03 NOTE — ED Provider Notes (Signed)
[facility] Provider Note    Event Date/Time   First MD Initiated Contact with Patient 01/03/22 0719     (approximate)   History   Chief Complaint Rib Injury and Fall   HPI Nicholas Robles. is a 36 y.o. male, history of GERD, presents the emergency department for evaluation of rib pain.  Reports this placing one of his feet while he was 5 feet up the ladder, causing him to slip and fall off the ladder.  The foot of the ladder came down on the left side of his chest.  Denies any head injury or LOC.  Currently endorsing left-sided rib pain, particular when taking a deep breath.  Denies fever/chills, shortness of breath, abdominal pain, nausea/vomiting, vision changes, hearing changes, rash/lesions, arm/leg pain, neck pain, or back pain.  History Limitations: No limitations.        Physical Exam  Triage Vital Signs: ED Triage Vitals [01/03/22 0716]  Enc Vitals Group     BP      Pulse      Resp      Temp      Temp src      SpO2      Weight 180 lb (81.6 kg)     Height 6\' 2"  (1.88 m)     Head Circumference      Peak Flow      Pain Score 8     Pain Loc      Pain Edu?      Excl. in GC?     Most recent vital signs: Vitals:   01/03/22 0724  BP: 102/63  Pulse: 63  Temp: 97.7 F (36.5 C)  SpO2: 99%    General: Awake, NAD.  Skin: Warm, dry. No rashes or lesions.  Eyes: PERRL. Conjunctivae normal.  CV: Good peripheral perfusion.  Resp: Normal effort.  Lung sounds are clear bilaterally in the apices and bases. Abd: Soft, non-tender. No distention.  Neuro: At baseline. No gross neurological deficits.   Focused Exam: Exquisite tenderness on the left side of the chest wall, particularly around ribs #9 through 11, posterior lateral aspect.  No gross deformities.  No surrounding warmth or erythema.  No crepitus felt.  Physical Exam    ED Results / Procedures / Treatments  Labs (all labs ordered are listed, but only abnormal results are  displayed) Labs Reviewed - No data to display   EKG N/A.   RADIOLOGY  ED Provider Interpretation: I personally viewed and interpreted this x-ray, no evidence of rib fractures or pneumothorax.  DG Ribs Unilateral W/Chest Left  Result Date: 01/03/2022 CLINICAL DATA:  Left-sided rib pain EXAM: LEFT RIBS AND CHEST - 3+ VIEW COMPARISON:  None Available. FINDINGS: No fracture or other bone lesions are seen involving the ribs. There is no evidence of pneumothorax or pleural effusion. Both lungs are clear. Heart size and mediastinal contours are within normal limits. IMPRESSION: Negative. Electronically Signed   By: 01/05/2022 M.D.   On: 01/03/2022 08:40    PROCEDURES:  Critical Care performed: N/A  Procedures    MEDICATIONS ORDERED IN ED: Medications - No data to display   IMPRESSION / MDM / ASSESSMENT AND PLAN / ED COURSE  I reviewed the triage vital signs and the nursing notes.                              Differential diagnosis includes, but is  not limited to, rib fractures, intercostal muscle strain, pneumothorax, hemothorax.  ED Course Patient appears well, vitals within normal limits.  NAD.  Not requesting any pain medication at this time.  Assessment/Plan Patient presents with left-sided rib injury following mechanical fall.  Chest x-ray shows no evidence of rib fractures or pneumothorax at this time.  Low suspicion for any serious or life-threatening pathology, however I would not be surprised if he has a small rib fracture that has not been detected by plain film.  CT unlikely to change management at this time.  We will prescribe him naproxen and hydrocodone/acetaminophen for pain control as needed.  Encouraged him to continue taking deep breaths throughout the day to avoid complications.  We will plan to discharge.  Provided the patient with anticipatory guidance, return precautions, and educational material. Encouraged the patient to return to the emergency department  at any time if they begin to experience any new or worsening symptoms. Patient expressed understanding and agreed with the plan.       FINAL CLINICAL IMPRESSION(S) / ED DIAGNOSES   Final diagnoses:  Rib injury     Rx / DC Orders   ED Discharge Orders          Ordered    naproxen (NAPROSYN) 500 MG tablet  2 times daily with meals        01/03/22 0914    HYDROcodone-acetaminophen (NORCO/VICODIN) 5-325 MG tablet  Every 6 hours PRN        01/03/22 0914             Note:  This document was prepared using Dragon voice recognition software and may include unintentional dictation errors.   Varney Daily, [place] 01/03/22 9450    Jene Every, MD 01/03/22 [phone]

## 2022-01-03 NOTE — ED Notes (Signed)
Pt states he fell off ladder yesterday at work and injured left sided. Pt states he does not wish to file for WC. Pt states intense pain. No bruising noted.

## 2022-01-03 NOTE — Discharge Instructions (Addendum)
-  Take naproxen as needed for pain.  You may additionally utilize hydrocodone sparingly.  -Continue to take deep breaths throughout the day to prevent complications.  -Return to the emergency department anytime if you begin to experience any new or worsening symptoms.

## 2022-11-21 ENCOUNTER — Encounter: Payer: Self-pay | Admitting: Family

## 2022-11-21 ENCOUNTER — Ambulatory Visit: Payer: Self-pay | Admitting: Family

## 2022-11-21 DIAGNOSIS — Z113 Encounter for screening for infections with a predominantly sexual mode of transmission: Secondary | ICD-10-CM

## 2022-11-21 NOTE — Progress Notes (Signed)
[facility] Department STI clinic/screening visit  Subjective:  Alin Maisto. is a 37 y.o. male being seen today for an STI screening visit. The patient reports they do have symptoms.  Declines bloodwork today, states had HIV testing at his PCP.  Patient has the following medical conditions:   Patient Active Problem List   Diagnosis Date Noted   Gastroesophageal reflux disease without esophagitis 10/30/2016   Chronic tension-type headache, not intractable 06/24/2016   Postconcussion syndrome 06/24/2016     Chief Complaint  Patient presents with   SEXUALLY TRANSMITTED DISEASE    Screening.  "Tingle when I pee 2-3 x/day for 2-3 weeks"    HPI  Patient reports symptoms for almost 3 weeks, has continued to have sex because his partner has no symptoms. Only uses condoms sometimes, and has only 1 partner.  Last HIV test per patient/review of record was  Lab Results  Component Value Date   HMHIVSCREEN Negative - Validated 05/14/2021   No results found for: "HIV"  Does the patient or their partner desires a pregnancy in the next year? No  Screening for MPX risk: Does the patient have an unexplained rash? No Is the patient MSM? No Does the patient endorse multiple sex partners or anonymous sex partners? No Did the patient have close or sexual contact with a person diagnosed with MPX? No Has the patient traveled outside the [place] where MPX is endemic? No Is there a high clinical suspicion for MPX-- evidenced by one of the following No  -Unlikely to be chickenpox  -Lymphadenopathy  -Rash that present in same phase of evolution on any given body part   See flowsheet for further details and programmatic requirements.   Immunization History  Administered Date(s) Administered   Tdap 09/22/2020     The following portions of the patient's history were reviewed and updated as appropriate: allergies, current medications, past medical history, past social history, past  surgical history and problem list.  Objective:  There were no vitals filed for this visit.  Physical Exam Nursing note reviewed.  Constitutional:      Appearance: Normal appearance.  HENT:     Mouth/Throat:     Mouth: Mucous membranes are moist.     Pharynx: Oropharynx is clear. No oropharyngeal exudate or posterior oropharyngeal erythema.  Abdominal:     Hernia: There is no hernia in the left inguinal area or right inguinal area.  Genitourinary:    Pubic Area: No rash.      Penis: Circumcised. No erythema, tenderness, discharge, swelling or lesions.      Testes:        Right: Mass, tenderness or swelling not present.        Left: Mass, tenderness or swelling not present.     Rectum: Normal.  Lymphadenopathy:     Cervical: No cervical adenopathy.     Upper Body:     Right upper body: No axillary or epitrochlear adenopathy.     Left upper body: No supraclavicular, axillary or epitrochlear adenopathy.     Lower Body: No right inguinal adenopathy. No left inguinal adenopathy.  Skin:    General: Skin is warm and dry.  Neurological:     Mental Status: He is alert and oriented to person, place, and time.  Psychiatric:        Mood and Affect: Mood normal.        Behavior: Behavior normal.       Assessment and Plan:  Shelva Majestic  Montez Hageman. is a 37 y.o. male presenting to the [facility] Department for STI screening  1. Screening for venereal disease Will contact with positive results Use condoms for all sex  - Chlamydia/GC NAA, Confirmation  Patient does have STI symptoms Patient accepted all screenings including  urine GC/Chlamydia Patient meets criteria for HepB screening? No. Ordered? no Patient meets criteria for HepC screening? No. Ordered? no Recommended condom use with all sex Discussed importance of condom use for STI prevent  Treat positive test results per standing order. Discussed time line for [facility] results and that patient will be called  with positive results and encouraged patient to call if he had not heard in 2 weeks Recommended repeat testing in 3 months with positive results. Recommended returning for continued or worsening symptoms.   Return if symptoms worsen or fail to improve.  No future appointments.  Jerrell Belfast, FNP

## 2022-11-21 NOTE — Progress Notes (Signed)
Pt is here with STD screening.  Condoms given.  Windle Guard, RN

## 2022-11-25 LAB — CHLAMYDIA/GC NAA, CONFIRMATION
Chlamydia trachomatis, NAA: NEGATIVE
Neisseria gonorrhoeae, NAA: NEGATIVE

## 2022-11-27 IMAGING — CR DG RIBS W/ CHEST 3+V*L*
1 series · 5 of 5 positions shown · non-contrast
Comparison: None Available.

CLINICAL DATA: Left-sided rib pain

EXAM:
LEFT RIBS AND CHEST - 3+ VIEW

[Series 1: dg ribs unilateral w/chest left · 0.14mm/px · 5 of 5 slices shown]
[im 1/5]
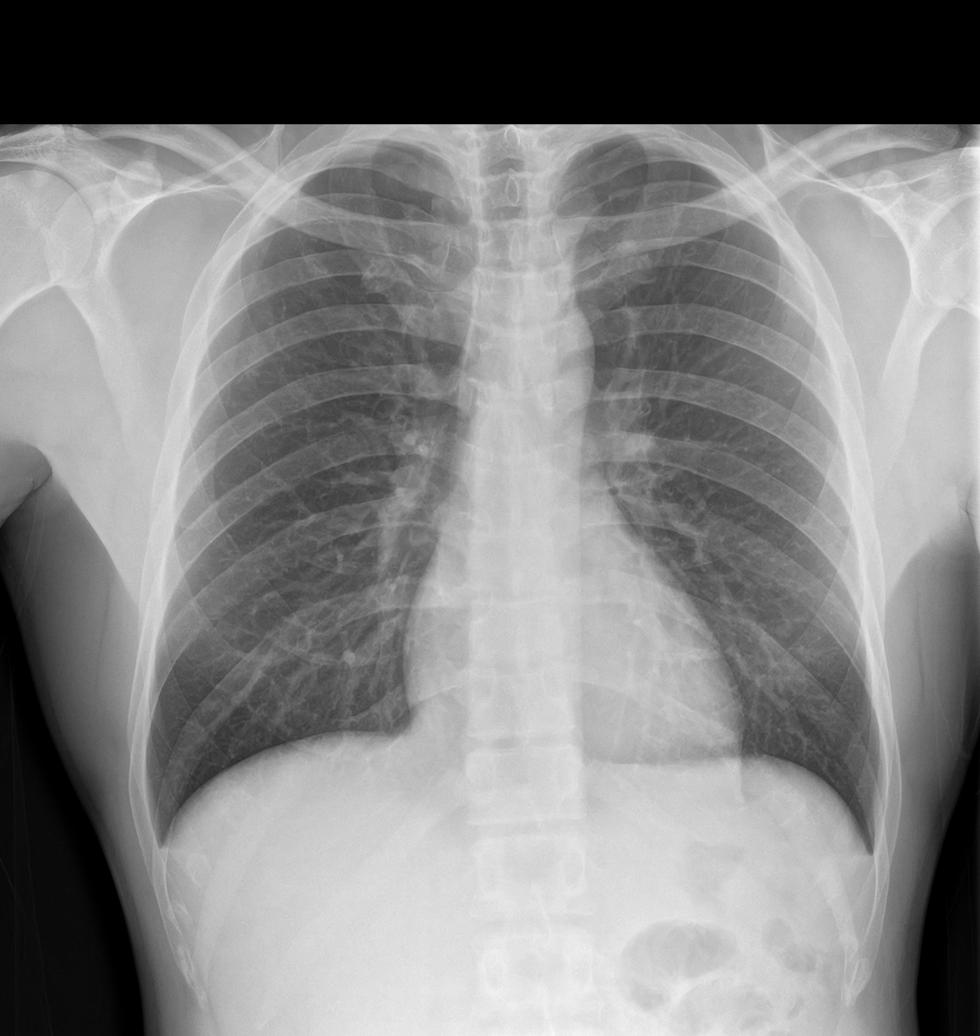
[im 2/5]
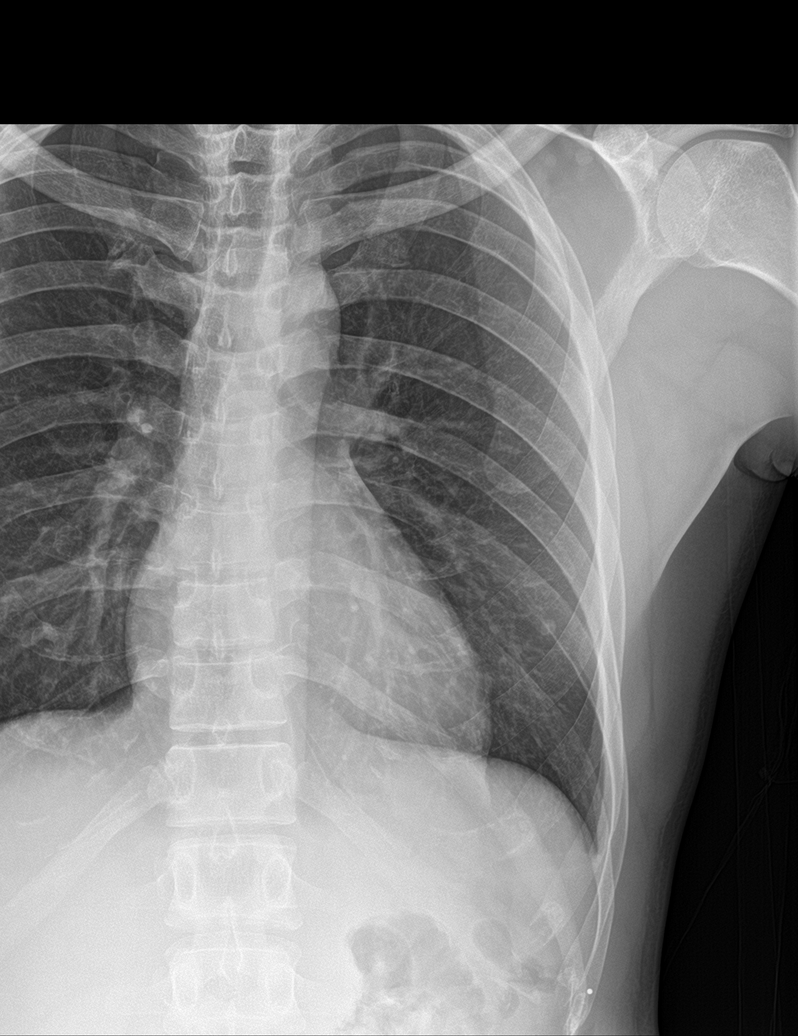
[im 3/5]
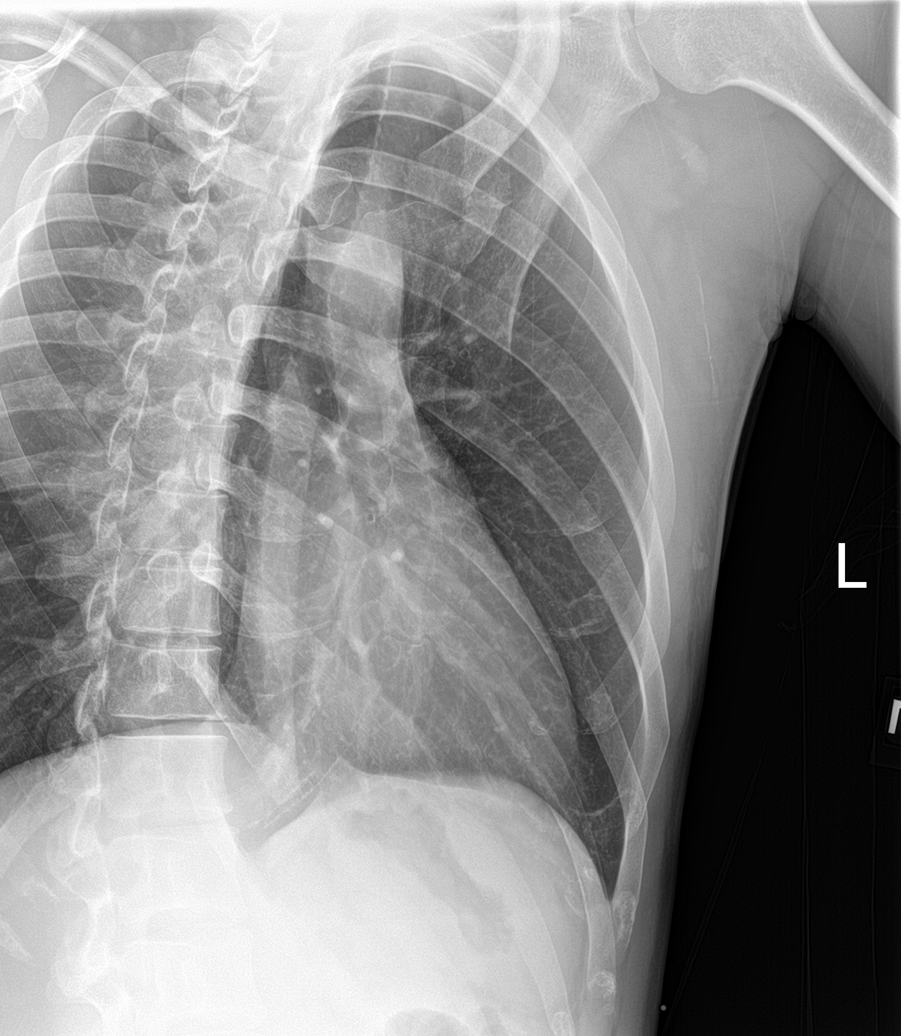
[im 4/5]
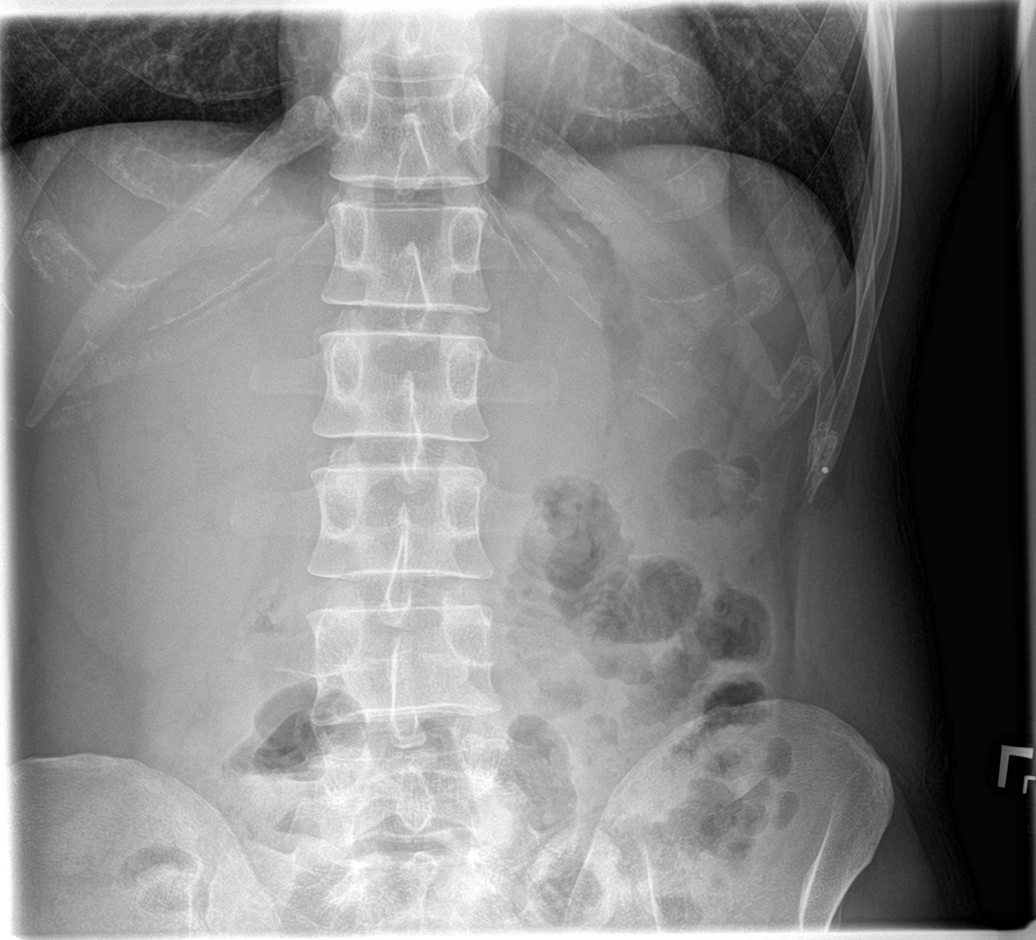
[im 5/5]
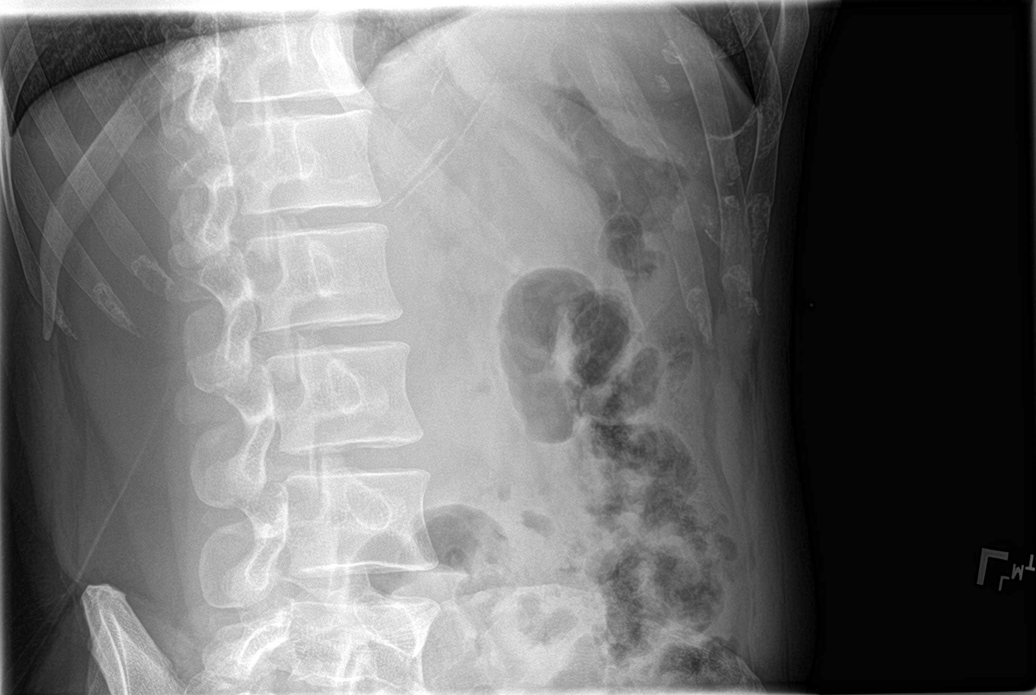

[5 of 5 positions shown; findings below may reference images not displayed]

FINDINGS: No fracture or other bone lesions are seen involving the ribs. There
is no evidence of pneumothorax or pleural effusion. Both lungs are
clear. Heart size and mediastinal contours are within normal limits.
IMPRESSION: Negative.

## 2022-12-17 ENCOUNTER — Emergency Department
Admission: EM | Admit: 2022-12-17 | Discharge: 2022-12-17 | Disposition: A | Discharge status: Home / Self Care | Payer: Self-pay | Attending: Emergency Medicine | Admitting: Emergency Medicine

## 2022-12-17 ENCOUNTER — Other Ambulatory Visit: Payer: Self-pay

## 2022-12-17 DIAGNOSIS — R300 Dysuria: Secondary | ICD-10-CM | POA: Insufficient documentation

## 2022-12-17 LAB — CHLAMYDIA/NGC RT PCR (ARMC ONLY)
Chlamydia Tr: NOT DETECTED
N gonorrhoeae: DETECTED — AB

## 2022-12-17 LAB — URINALYSIS, ROUTINE W REFLEX MICROSCOPIC
Bilirubin Urine: NEGATIVE
Glucose, UA: NEGATIVE mg/dL
Hgb urine dipstick: NEGATIVE
Ketones, ur: NEGATIVE mg/dL
Nitrite: NEGATIVE
Protein, ur: NEGATIVE mg/dL
Specific Gravity, Urine: 1.009 (ref 1.005–1.030)
Squamous Epithelial / HPF: NONE SEEN /HPF (ref 0–5)
WBC, UA: >50 WBC/hpf (ref 0–5)
pH: 6 (ref 5.0–8.0)

## 2022-12-17 MED ORDER — CEFTRIAXONE SODIUM 1 G IJ SOLR
500.0000 mg | Freq: Once | INTRAMUSCULAR | Status: AC
  Administered 2022-12-17: 500 mg via INTRAMUSCULAR
  Filled 2022-12-17: qty 10

## 2022-12-17 MED ORDER — LIDOCAINE HCL (PF) 1 % IJ SOLN
INTRAMUSCULAR | Status: AC
  Filled 2022-12-17: qty 5

## 2022-12-17 MED ORDER — DOXYCYCLINE HYCLATE 100 MG PO TABS: 100.0000 mg | ORAL_TABLET | Freq: Two times a day (BID) | ORAL | 0 refills | Status: AC

## 2022-12-17 NOTE — ED Provider Notes (Signed)
[facility] Provider Note  Patient Contact: 4:06 PM (approximate)   History   Dysuria   HPI  Nicholas Broad. is a 37 y.o. male who presents to the emergency department complaining of dysuria and discharge.  Symptoms have been ongoing x 3 days.  Patient denies any hematuria.  No abdominal or flank pain.  Patient states that he is sexually active, has had multiple partners with unprotected sex.  Patient denies any testicular pain, external lesions or chancres.  Patient states that he had similar symptoms roughly a month ago, went to the whole department and had a negative test for STDs.     Physical Exam   Triage Vital Signs: ED Triage Vitals [12/17/22 1534]  Enc Vitals Group     BP 130/71     Pulse Rate 68     Resp 18     Temp 98.3 F (36.8 C)     Temp Source Oral     SpO2 97 %     Weight 179 lb 14.3 oz (81.6 kg)     Height 6\' 2"  (1.88 m)     Head Circumference      Peak Flow      Pain Score 10     Pain Loc      Pain Edu?      Excl. in GC?     Most recent vital signs: Vitals:   12/17/22 1534  BP: 130/71  Pulse: 68  Resp: 18  Temp: 98.3 F (36.8 C)  SpO2: 97%     General: Alert and in no acute distress.  Cardiovascular:  Good peripheral perfusion Respiratory: Normal respiratory effort without tachypnea or retractions. Lungs CTAB.  Gastrointestinal: Bowel sounds 4 quadrants. Soft and nontender to palpation. No guarding or rigidity. No palpable masses. No distention. No CVA tenderness Musculoskeletal: Full range of motion to all extremities.  Neurologic:  No gross focal neurologic deficits are appreciated.  Skin:   No rash noted Other:   ED Results / Procedures / Treatments   Labs (all labs ordered are listed, but only abnormal results are displayed) Labs Reviewed  URINALYSIS, ROUTINE W REFLEX MICROSCOPIC - Abnormal; Notable for the following components:      Result Value   Color, Urine YELLOW (*)    APPearance CLOUDY  (*)    Leukocytes,Ua LARGE (*)    Bacteria, UA RARE (*)    All other components within normal limits  CHLAMYDIA/NGC RT PCR ([facility] ONLY)               EKG     RADIOLOGY    No results found.  PROCEDURES:  Critical Care performed: No  Procedures   MEDICATIONS ORDERED IN ED: Medications  cefTRIAXone (ROCEPHIN) injection 500 mg (500 mg Intramuscular Given 12/17/22 1619)  lidocaine (PF) (XYLOCAINE) 1 % injection (  Given 12/17/22 1619)     IMPRESSION / MDM / ASSESSMENT AND PLAN / ED COURSE  I reviewed the triage vital signs and the nursing notes.                                 Differential diagnosis includes, but is not limited to, gonorrhea, chlamydia, nongonococcal urethritis, UTI  Patient's presentation is most consistent with acute presentation with potential threat to life or bodily function.   Patient's diagnosis is consistent with dysuria.  Patient presents emergency department with burning with urination and discharge.  Symptoms for 3 days.  Has had recent unprotected sex.  Low suspicion for UTI suspect nongonococcal urethritis versus gonorrhea or chlamydia.  Testing is still pending we will treat empirically based off his symptoms with Rocephin here and doxycycline at home.  Concerning signs and symptoms and return precaution discussed with the patient.  Follow-up with primary care or health department as needed.. Patient is given ED precautions to return to the ED for any worsening or new symptoms.     FINAL CLINICAL IMPRESSION(S) / ED DIAGNOSES   Final diagnoses:  Dysuria     Rx / DC Orders   ED Discharge Orders          Ordered    doxycycline (VIBRA-TABS) 100 MG tablet  2 times daily        12/17/22 1605             Note:  This document was prepared using Dragon voice recognition software and may include unintentional dictation errors.   Racheal Patches, PA-C 12/17/22 1622    Merwyn Katos, MD 12/17/22 1740

## 2022-12-17 NOTE — ED Triage Notes (Signed)
Pt here with dysuria since Sun. Pt states it burns when he urinates and he also is having some yellow white discharge. Pt has had more than more one sexual partner.

## 2022-12-17 NOTE — ED Notes (Signed)
Pt discharge to home. Pt VSS, GCS 15, NAD. Pt verbalized understanding of discharge instructions with no additional questions at this time.  

## 2022-12-23 ENCOUNTER — Telehealth: Payer: Self-pay | Admitting: Emergency Medicine

## 2022-12-23 NOTE — Telephone Encounter (Signed)
Patient did not see result of std in mychart, so I did call him to notify of positive gonorrhea.  He was treated in ED.  Advised to notify partner/s and that free treatment is available at [facility].

## 2023-04-06 ENCOUNTER — Emergency Department
Admission: EM | Admit: 2023-04-06 | Discharge: 2023-04-06 | Disposition: A | Discharge status: Home / Self Care | Payer: Self-pay | Attending: Emergency Medicine | Admitting: Emergency Medicine

## 2023-04-06 ENCOUNTER — Other Ambulatory Visit: Payer: Self-pay

## 2023-04-06 DIAGNOSIS — K029 Dental caries, unspecified: Secondary | ICD-10-CM | POA: Insufficient documentation

## 2023-04-06 DIAGNOSIS — K047 Periapical abscess without sinus: Secondary | ICD-10-CM | POA: Insufficient documentation

## 2023-04-06 MED ORDER — KETOROLAC TROMETHAMINE 15 MG/ML IJ SOLN
15.0000 mg | Freq: Once | INTRAMUSCULAR | Status: AC
  Administered 2023-04-06: 15 mg via INTRAMUSCULAR
  Filled 2023-04-06: qty 1

## 2023-04-06 MED ORDER — AMOXICILLIN-POT CLAVULANATE 875-125 MG PO TABS: 1.0000 | ORAL_TABLET | Freq: Two times a day (BID) | ORAL | 0 refills | Status: AC

## 2023-04-06 MED ORDER — CHLORHEXIDINE GLUCONATE 0.12 % MT SOLN: 15.0000 mL | Freq: Two times a day (BID) | OROMUCOSAL | 0 refills | Status: AC

## 2023-04-06 NOTE — ED Triage Notes (Signed)
Right sided dental pain. Onset of symptoms 0300

## 2023-04-06 NOTE — ED Provider Notes (Signed)
[facility] Provider Note    Event Date/Time   First MD Initiated Contact with Patient 04/06/23 [id number]     (approximate)   History   Dental Pain   HPI  Nicholas Robles. is a 38 y.o. male who presents today for evaluation of right sided dental pain that began at 3 AM today.  Patient reports that he has a known "bad tooth" but he has not yet followed up with his dentist.  No fevers or chills.  No swelling to his face or neck.  He has not had any trouble swallowing.  No intraoral swelling.  Patient Active Problem List   Diagnosis Date Noted   Gastroesophageal reflux disease without esophagitis 10/30/2016   Chronic tension-type headache, not intractable 06/24/2016   Postconcussion syndrome 06/24/2016          Physical Exam   Triage Vital Signs: ED Triage Vitals  Encounter Vitals Group     BP 04/06/23 0816 126/84     Systolic BP Percentile --      Diastolic BP Percentile --      Pulse Rate 04/06/23 0816 62     Resp 04/06/23 0816 18     Temp 04/06/23 0816 98.6 F (37 C)     Temp Source 04/06/23 0816 Oral     SpO2 04/06/23 0816 98 %     Weight 04/06/23 0803 179 lb 14.3 oz (81.6 kg)     Height 04/06/23 0803 6\' 2"  (1.88 m)     Head Circumference --      Peak Flow --      Pain Score 04/06/23 0803 10     Pain Loc --      Pain Education --      Exclude from Growth Chart --     Most recent vital signs: Vitals:   04/06/23 0816  BP: 126/84  Pulse: 62  Resp: 18  Temp: 98.6 F (37 C)  SpO2: 98%    Physical Exam Vitals and nursing note reviewed.  Constitutional:      General: Awake and alert. No acute distress.    Appearance: Normal appearance. The patient is normal weight.  HENT:     Head: Normocephalic and atraumatic.     Mouth: Mucous membranes are moist.  Poor dentition with tooth #32 with obvious dental decay and irritated gingiva without gingival fluctuance.  No sublingual swelling.  No drooling.  No trismus.  No facial or neck  swelling or erythema.  Normal voice. Eyes:     General: PERRL. Normal EOMs        Right eye: No discharge.        Left eye: No discharge.     Conjunctiva/sclera: Conjunctivae normal.  Cardiovascular:     Rate and Rhythm: Normal rate and regular rhythm.     Pulses: Normal pulses.  Pulmonary:     Effort: Pulmonary effort is normal. No respiratory distress.     Breath sounds: Normal breath sounds.  Abdominal:     Abdomen is soft. There is no abdominal tenderness. No rebound or guarding. No distention. Musculoskeletal:        General: No swelling. Normal range of motion.     Cervical back: Normal range of motion and neck supple.  Skin:    General: Skin is warm and dry.     Capillary Refill: Capillary refill takes less than 2 seconds.     Findings: No rash.  Neurological:     Mental Status:  The patient is awake and alert.      ED Results / Procedures / Treatments   Labs (all labs ordered are listed, but only abnormal results are displayed) Labs Reviewed - No data to display   EKG     RADIOLOGY     PROCEDURES:  Critical Care performed:   Procedures   MEDICATIONS ORDERED IN ED: Medications  ketorolac (TORADOL) 15 MG/ML injection 15 mg (15 mg Intramuscular Given 04/06/23 0853)     IMPRESSION / MDM / ASSESSMENT AND PLAN / ED COURSE  I reviewed the triage vital signs and the nursing notes.   Differential diagnosis includes, but is not limited to, dental decay, dental caries, pulpitis, abscess.  Patient is awake and alert, hemodynamically stable and afebrile.  Patient has obvious dental decay to tooth #32, I suspect some dental caries vs developing abscess v pulpitits. No gingival swelling or fluctuance concerning for gingival abscess.  No trismus, nuchal rigidity, neck pain, hot potato voice, uvular deviation or malocclusion to suggest deep space infection. No sublingual swelling concerning for Ludwig's angina.  Patient was started on antibiotics and chlorhexidine  mouth rinse.  Patient was treated symptomatically in the emergency department. Discussed care plan, return precautions, and advised close outpatient follow-up with dentist.  He was given a list of low-cost dental clinics in the area.  Patient agrees with plan of care.   Patient's presentation is most consistent with acute illness / injury with system symptoms.      FINAL CLINICAL IMPRESSION(S) / ED DIAGNOSES   Final diagnoses:  Dental caries  Pain due to dental caries  Dental infection     Rx / DC Orders   ED Discharge Orders          Ordered    chlorhexidine (PERIDEX) 0.12 % solution  2 times daily        04/06/23 0914    amoxicillin-clavulanate (AUGMENTIN) 875-125 MG tablet  2 times daily        04/06/23 0914             Note:  This document was prepared using Dragon voice recognition software and may include unintentional dictation errors.   Keturah Shavers 04/06/23 0957    Sharman Cheek, MD 04/06/23 [phone]

## 2023-04-06 NOTE — Discharge Instructions (Addendum)
Please take the medications as prescribed.  Please return for any new, worsening, or change in symptoms or other concerns.  Please follow-up with your dentist soon as possible or call a dentist on the list that was provided for you.  It was a pleasure caring for you today.

## 2023-05-27 ENCOUNTER — Ambulatory Visit: Payer: Self-pay | Admitting: Pediatrics

## 2023-05-28 ENCOUNTER — Ambulatory Visit: Payer: Self-pay | Admitting: Pediatrics

## 2023-06-02 ENCOUNTER — Ambulatory Visit: Payer: Self-pay | Admitting: Pediatrics
